# Patient Record
Sex: Male | Born: 1940 | Race: Black or African American | Hispanic: No | Marital: Single | Smoking: Current every day smoker
Health system: Southern US, Community
[De-identification: ages and names within clinical notes are randomized; demographics above are authoritative.]

## PROBLEM LIST (undated history)

## (undated) DIAGNOSIS — F329 Major depressive disorder, single episode, unspecified: Secondary | ICD-10-CM

## (undated) DIAGNOSIS — N400 Enlarged prostate without lower urinary tract symptoms: Secondary | ICD-10-CM

## (undated) DIAGNOSIS — E109 Type 1 diabetes mellitus without complications: Secondary | ICD-10-CM

## (undated) DIAGNOSIS — I10 Essential (primary) hypertension: Secondary | ICD-10-CM

## (undated) DIAGNOSIS — E785 Hyperlipidemia, unspecified: Secondary | ICD-10-CM

## (undated) DIAGNOSIS — F419 Anxiety disorder, unspecified: Secondary | ICD-10-CM

## (undated) DIAGNOSIS — E559 Vitamin D deficiency, unspecified: Secondary | ICD-10-CM

## (undated) DIAGNOSIS — F32A Depression, unspecified: Secondary | ICD-10-CM

## (undated) DIAGNOSIS — F0390 Unspecified dementia without behavioral disturbance: Secondary | ICD-10-CM

## (undated) HISTORY — DX: Type 1 diabetes mellitus without complications: E10.9

## (undated) HISTORY — PX: CATARACT EXTRACTION W/ INTRAOCULAR LENS IMPLANT: SHX1309

## (undated) HISTORY — DX: Depression, unspecified: F32.A

## (undated) HISTORY — DX: Unspecified dementia, unspecified severity, without behavioral disturbance, psychotic disturbance, mood disturbance, and anxiety: F03.90

## (undated) HISTORY — DX: Major depressive disorder, single episode, unspecified: F32.9

## (undated) HISTORY — DX: Essential (primary) hypertension: I10

---

## 2009-06-19 ENCOUNTER — Emergency Department (HOSPITAL_COMMUNITY): Admission: EM | Admit: 2009-06-19 | Discharge: 2009-06-19 | Payer: Self-pay | Admitting: Emergency Medicine

## 2009-06-19 ENCOUNTER — Ambulatory Visit: Payer: Self-pay | Admitting: Vascular Surgery

## 2009-06-19 ENCOUNTER — Encounter (INDEPENDENT_AMBULATORY_CARE_PROVIDER_SITE_OTHER): Payer: Self-pay | Admitting: Emergency Medicine

## 2009-07-08 ENCOUNTER — Emergency Department (HOSPITAL_BASED_OUTPATIENT_CLINIC_OR_DEPARTMENT_OTHER): Admission: EM | Admit: 2009-07-08 | Discharge: 2009-07-08 | Payer: Self-pay | Admitting: Emergency Medicine

## 2009-07-08 ENCOUNTER — Ambulatory Visit: Payer: Self-pay | Admitting: Diagnostic Radiology

## 2010-06-23 LAB — DIFFERENTIAL
Basophils Absolute: 0 10*3/uL (ref 0.0–0.1)
Lymphs Abs: 1.7 10*3/uL (ref 0.7–4.0)
Monocytes Absolute: 0.5 10*3/uL (ref 0.1–1.0)
Monocytes Relative: 6 % (ref 3–12)
Neutro Abs: 6 10*3/uL (ref 1.7–7.7)

## 2010-06-23 LAB — CBC
MCHC: 33.7 g/dL (ref 30.0–36.0)
RBC: 4.54 MIL/uL (ref 4.22–5.81)
RDW: 14.4 % (ref 11.5–15.5)
WBC: 8.3 10*3/uL (ref 4.0–10.5)

## 2010-06-23 LAB — BASIC METABOLIC PANEL
CO2: 28 mEq/L (ref 19–32)
Calcium: 9.5 mg/dL (ref 8.4–10.5)
Creatinine, Ser: 0.9 mg/dL (ref 0.4–1.5)
GFR calc non Af Amer: >60 mL/min (ref >60)

## 2010-06-23 LAB — POCT TOXICOLOGY PANEL

## 2010-06-23 LAB — URINALYSIS, ROUTINE W REFLEX MICROSCOPIC
Hgb urine dipstick: NEGATIVE
Urobilinogen, UA: 0.2 mg/dL (ref 0.0–1.0)

## 2010-06-23 LAB — HEPATIC FUNCTION PANEL
Albumin: 4 g/dL (ref 3.5–5.2)
Bilirubin, Direct: 0 mg/dL (ref 0.0–0.3)
Total Protein: 7.9 g/dL (ref 6.0–8.3)

## 2010-06-23 LAB — ETHANOL: Alcohol, Ethyl (B): <5 mg/dL (ref 0–10)

## 2010-06-28 LAB — CBC
Hemoglobin: 13.2 g/dL (ref 13.0–17.0)
MCV: 92.8 fL (ref 78.0–100.0)
RBC: 4.21 MIL/uL — ABNORMAL LOW (ref 4.22–5.81)
WBC: 8.3 10*3/uL (ref 4.0–10.5)

## 2010-06-28 LAB — DIFFERENTIAL
Basophils Absolute: 0 10*3/uL (ref 0.0–0.1)
Eosinophils Relative: 5 % (ref 0–5)
Monocytes Absolute: 0.6 10*3/uL (ref 0.1–1.0)
Neutrophils Relative %: 59 % (ref 43–77)

## 2010-06-28 LAB — GLUCOSE, CAPILLARY: Glucose-Capillary: 117 mg/dL — ABNORMAL HIGH (ref 70–99)

## 2010-06-28 LAB — BASIC METABOLIC PANEL
BUN: 8 mg/dL (ref 6–23)
Glucose, Bld: 99 mg/dL (ref 70–99)
Potassium: 4.8 mEq/L (ref 3.5–5.1)

## 2011-04-12 ENCOUNTER — Ambulatory Visit (HOSPITAL_COMMUNITY): Payer: Self-pay | Admitting: Psychiatry

## 2011-04-22 ENCOUNTER — Ambulatory Visit (HOSPITAL_COMMUNITY): Payer: Self-pay | Admitting: Psychiatry

## 2011-04-29 ENCOUNTER — Ambulatory Visit (HOSPITAL_COMMUNITY): Payer: Self-pay | Admitting: Psychiatry

## 2011-05-04 ENCOUNTER — Encounter (HOSPITAL_COMMUNITY): Payer: Self-pay | Admitting: Psychiatry

## 2011-05-04 ENCOUNTER — Ambulatory Visit (INDEPENDENT_AMBULATORY_CARE_PROVIDER_SITE_OTHER): Payer: Medicare Other | Admitting: Psychiatry

## 2011-05-04 DIAGNOSIS — F1011 Alcohol abuse, in remission: Secondary | ICD-10-CM

## 2011-05-04 DIAGNOSIS — F329 Major depressive disorder, single episode, unspecified: Secondary | ICD-10-CM

## 2011-05-04 DIAGNOSIS — I10 Essential (primary) hypertension: Secondary | ICD-10-CM

## 2011-05-04 DIAGNOSIS — Z716 Tobacco abuse counseling: Secondary | ICD-10-CM

## 2011-05-04 DIAGNOSIS — F32A Depression, unspecified: Secondary | ICD-10-CM

## 2011-05-04 DIAGNOSIS — F0391 Unspecified dementia with behavioral disturbance: Secondary | ICD-10-CM | POA: Insufficient documentation

## 2011-05-04 DIAGNOSIS — E119 Type 2 diabetes mellitus without complications: Secondary | ICD-10-CM

## 2011-05-04 DIAGNOSIS — Z794 Long term (current) use of insulin: Secondary | ICD-10-CM

## 2011-05-04 DIAGNOSIS — Z7189 Other specified counseling: Secondary | ICD-10-CM

## 2011-05-04 DIAGNOSIS — F0390 Unspecified dementia without behavioral disturbance: Secondary | ICD-10-CM

## 2011-05-04 DIAGNOSIS — F03918 Unspecified dementia, unspecified severity, with other behavioral disturbance: Secondary | ICD-10-CM | POA: Insufficient documentation

## 2011-05-04 DIAGNOSIS — G4700 Insomnia, unspecified: Secondary | ICD-10-CM

## 2011-05-04 MED ORDER — RISPERIDONE 2 MG PO TABS: ORAL_TABLET | ORAL | Status: DC

## 2011-05-04 MED ORDER — BUPROPION HCL ER (XL) 150 MG PO TB24: 150.0000 mg | ORAL_TABLET | Freq: Every day | ORAL | Status: DC

## 2011-05-04 MED ORDER — TRAZODONE HCL 50 MG PO TABS: 50.0000 mg | ORAL_TABLET | Freq: Every day | ORAL | Status: AC

## 2011-05-04 MED ORDER — MEMANTINE HCL 5 MG PO TABS: 5.0000 mg | ORAL_TABLET | Freq: Every day | ORAL | Status: DC

## 2011-05-04 MED ORDER — LORAZEPAM 1 MG PO TABS: ORAL_TABLET | ORAL | Status: DC

## 2011-05-04 NOTE — Patient Instructions (Addendum)
Your  history of being in the [employer] at [facility] has been reviewed as well as the medications you have been taking. You at have reported that you were sleeping through most of the day and at night you can be restless and tosses and turn. Medications have been modified for it the following reasons. It is very possible that the Risperdal is causing sedation as well as treating any history of irritability. The medication Risperdal has been reduced to 2 mg to be given at night because it can induce sleep. The Ativan has been adjusted to the 1 mg at morning and one half tab after lunch. The evening Ativan needs to be discontinued. For sleep trazodone has been ordered at bedtime. It should be given approximately 30 minutes before sleep. The Wellbutrin has been ordered as is 150 mg Wellbutrin XL in the morning after breakfast. The Namenda 5 mg has been ordered to slow to the loss of memory.  Please call the office [phone] if there is any observation that Mr. Henricks is having an adverse reaction to any of these medications. If and also call if you have any questions about the medication changes. Remember if there is any extreme distress, depression, suicidal thinking, disorganized thoughts, please call 911 or called in nearest emergency department. Medications have been ordered with refills for 2 times if Mr. Gobert needs to return sooner than that please call the office for an appointment.  Telephone cal [phone] to Mrs. Brown:  She is asked to return call and leave the name and telephone no. Of his assisted living.  A call needs to be made to them in order to send Rx for Lamirctal [inadvertently overlooked].

## 2011-05-04 NOTE — Progress Notes (Signed)
Psychiatric Assessment Adult  Patient Identification:  Keyan Folson Date of Evaluation:  05/04/2011 Chief Complaint: 0 History of Chief Complaint:   Chief Complaint  Patient presents with  . Depression  . Medication Refill  . Dementia    HPI Review of Systems Physical Exam  Depressive Symptoms: depressed mood, anhedonia, hypersomnia, psychomotor retardation, difficulty concentrating, anxiety, disturbed sleep,  (Hypo) Manic Symptoms:   Elevated Mood:  No Irritable Mood:  No Grandiosity:  No Distractibility:  No Labiality of Mood:  No Delusions:  No Hallucinations:  No Impulsivity:  No Sexually Inappropriate Behavior:  No Financial Extravagance:  No Flight of Ideas:  No  Anxiety Symptoms: Excessive Worry:  No Panic Symptoms:  No Agoraphobia:  No Obsessive Compulsive: No  Symptoms: None, Specific Phobias:  No Social Anxiety:  Yes  Psychotic Symptoms:  Hallucinations: No None Delusions:  No Paranoia:  No   Ideas of Reference:  No  PTSD Symptoms: Ever had a traumatic exposure:  No Had a traumatic exposure in the last month:  No Re-experiencing: No None Hypervigilance:  No Hyperarousal: No None Avoidance: No None  Traumatic Brain Injury: No  Past Psychiatric History: Diagnosis: Depression, Dementia  Hospitalizations: 1  Outpatient Care: assisted living  Substance Abuse Care: no  Self-Mutilation: no  Suicidal Attempts: none  Violent Behaviors: only "irritable"   Past Medical History:   Past Medical History  Diagnosis Date  . Depression   . Dementia     may have had a stroke   History of Loss of Consciousness:  Yes Seizure History:  No Cardiac History:  Yes Allergies:   Allergies  Allergen Reactions  . Penicillins    Current Medications:  Current Outpatient Prescriptions  Medication Sig Dispense Refill  . buPROPion (WELLBUTRIN XL) 150 MG 24 hr tablet Take 1 tablet (150 mg total) by mouth daily after breakfast.  30 tablet  2  .  LORazepam (ATIVAN) 1 MG tablet Take 1 tab orally in the morning and one half tab in the afternoon after lunch.  45 tablet  2  . memantine (NAMENDA) 5 MG tablet Take 1 tablet (5 mg total) by mouth daily.  30 tablet  2  . risperiDONE (RISPERDAL) 2 MG tablet Take one tablet By mouth  before bedtime  30 tablet  2  . traZODone (DESYREL) 50 MG tablet Take 1 tablet (50 mg total) by mouth at bedtime.  30 tablet  2    Previous Psychotropic Medications:  Medication Dose   Risperdal 3mg   Ativan  2 12 mg  Wellbutrin XL  150 mg   Lamictal  100 mg  ambien 10 mg         Substance Abuse History in the last 12 months: Substance Age of 1st Use Last Use Amount Specific Type  Nicotine 15 presently  1/2 ppd cigarettes  Alcohol  15  2008  6 pk/d beer  Cannabis      Opiates      Cocaine      Methamphetamines      LSD      Ecstasy      Benzodiazepines      Caffeine      Inhalants      Others:                          Medical Consequences of Substance Abuse: 0  Legal Consequences of Substance Abuse: 0  Family Consequences of Substance Abuse: 0  Blackouts:  No DT's:  No Withdrawal Symptoms:  No None  Social History: Current Place of Residence: [place] Place of Birth: [place] Family Members: 8 Marital Status:  Divorced Children: 3  Sons:   Daughters:  Relationships: siter and brother i Social worker Education:  Brewing technologist: never worked as Public affairs consultant Religious Beliefs/Practices: 0 History of Abuse: none Armed forces technical officer; Hotel manager History:  Electronics engineer History: 0 Hobbies/Interests: 0  Family History:  History reviewed. No pertinent family history.  Mental Status Examination/Evaluation: Objective:  Appearance: Malodorous  Eye Contact::  Fair  Speech:  Slow  Volume:  Decreased  Mood:  fair  Affect:  Blunt  Thought Process:  Logical  Orientation:  Full  Thought Content:  0  Suicidal Thoughts:  No  Homicidal Thoughts:  No    Judgement:  Impaired  Insight:  Fair  Psychomotor Activity:  Decreased  Akathisia:  No  Handed:  Right  AIMS (if indicated):  0  Assets:  Financial Resources/Insurance Housing Social Support    Laboratory/X-Ray Psychological Evaluation(s)   0  0   Assessment:  This is a man who was found unconscious of unknown hours/days by mailman.  He was sent to hospital for diabetes type II now taking insulin  He was sent to inpatient psychiatric unit in [place].  Today he is malodorous, wearing casual clothes.  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  H\is responses are slow but logical, speech is slightly slurred.  He is sleeping most of the day at assisted living.  He was placed in this facility when his sister Mrs. Denton Lank [retired professor of Education] was called to get him in [place] and took him to the assisted living facility.  She knows very little about his independent life after he retired [20 yrs] from [employer].  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    AXIS I Mood Disorder NOS, Social Anxiety and Dementia, reported by MD; past hx of found unconsious due to hyperglycemia  AXIS II Deferred  AXIS III Past Medical History  Diagnosis Date  . Depression   . Dementia     may have had a stroke     AXIS IV problems related to social environment  AXIS V 51-60 moderate symptoms   Treatment Plan/Recommendations:  Plan of Care: Pt escorted by elderly sister; requests appts. Every 6 mos.   Laboratory:  Later visit; suggest routine check  Psychotherapy: 0  Medications: see chart  Routine PRN Medications:  No  Consultations: 0  Safety Concerns:  Is in supervised assisted living   Other:  The sister is his payee for [place] benefits.  It is recommended to her to discuss and plan for 1- power of attorney and 2- help him decide on living will/advanced directive    Mickeal Skinner, MD 1/30/201311:23 AM

## 2011-05-11 ENCOUNTER — Ambulatory Visit (HOSPITAL_COMMUNITY): Payer: Self-pay | Admitting: Psychiatry

## 2011-08-02 ENCOUNTER — Ambulatory Visit (HOSPITAL_COMMUNITY): Payer: Self-pay | Admitting: Psychiatry

## 2011-08-03 ENCOUNTER — Encounter (HOSPITAL_COMMUNITY): Payer: Self-pay | Admitting: Psychiatry

## 2011-08-03 ENCOUNTER — Ambulatory Visit (INDEPENDENT_AMBULATORY_CARE_PROVIDER_SITE_OTHER): Payer: Medicare Other | Admitting: Psychiatry

## 2011-08-03 VITALS — BP 152/91 | HR 92 | Ht 67.0 in | Wt 172.0 lb

## 2011-08-03 DIAGNOSIS — F329 Major depressive disorder, single episode, unspecified: Secondary | ICD-10-CM

## 2011-08-03 DIAGNOSIS — F0390 Unspecified dementia without behavioral disturbance: Secondary | ICD-10-CM

## 2011-08-03 MED ORDER — BUPROPION HCL ER (XL) 150 MG PO TB24: 150.0000 mg | ORAL_TABLET | Freq: Every day | ORAL | Status: DC

## 2011-08-03 MED ORDER — RISPERIDONE 2 MG PO TABS: ORAL_TABLET | ORAL | Status: DC

## 2011-08-03 NOTE — Progress Notes (Addendum)
[facility] Follow-up Outpatient Visit  Majour Frei [birth year]  Date:    Mr. Meddaugh is a 71 Y/o male with a past psychiatric history significant for Dementia, not otherwise specified. The patient is referred for psychiatric services for medication management.   The patient denies any current stressors.  In the area of affective symptoms, patient appears euthymic. Patient denies current suicidal ideation, intent, or plan. Patient denies current homicidal ideation, intent, or plan. Patient denies auditory hallucinations. Patient denies visual hallucinations. Patient denies symptoms of paranoia. Patient states sleep is good. Appetite is good. Energy level is low. Patient denies symptoms of anhedonia. Patient denies hopelessness, helplessness, or guilt.   Denies any recent episodes consistent with mania, particularly decreased need for sleep with increased energy, grandiosity, impulsivity, hyperverbal and pressured speech, or increased productivity. Denies any recent symptoms consistent with psychosis, particularly auditory or visual hallucinations, thought broadcasting/insertion/withdrawal, or ideas of reference. Also denies excessive worry to the point of physical symptoms as well as any panic attacks. Denies any history of trauma or symptoms consistent with PTSD such as flashbacks, nightmares, hypervigilance, feelings of numbness or inability to connect with others.   Called patient's assisted living facility. Reported elevated blood pressures. Unable to ascertain indication for lamotrignine. Patient extender provider recently increased lorazepam.  Past Psychiatric History: Reviewed Diagnosis: Depression, Dementia   Hospitalizations: One previous psychiatric hospitalization  Outpatient Care: assisted living   Substance Abuse Care: no   Self-Mutilation: no   Suicidal Attempts: none   Violent Behaviors: only "irritable" -verbal aggressiveness   Past Medical History:   Reviewed Past Medical History   Diagnosis  Date   .  Depression    .  Dementia      may have had a stroke    History of Loss of Consciousness: Yes  Seizure History: No  Cardiac History: Yes  Allergies:  Reviewed Allergies   Allergen  Reactions   .  Penicillins     Current Medications:  Reviewed Current Outpatient Prescriptions on File Prior to Visit  Medication Sig Dispense Refill  . albuterol (PROVENTIL HFA;VENTOLIN HFA) 108 (90 BASE) MCG/ACT inhaler Inhale 2 puffs into the lungs every 6 (six) hours as needed.      Marland Kitchen buPROPion (WELLBUTRIN XL) 150 MG 24 hr tablet Take 1 tablet (150 mg total) by mouth daily after breakfast.  30 tablet  2  . cetirizine (ZYRTEC) 10 MG tablet Take 10 mg by mouth daily.      . hydrochlorothiazide (HYDRODIURIL) 25 MG tablet Take 25 mg by mouth daily.      . insulin aspart (NOVOLOG) 100 UNIT/ML injection Inject 2 Units into the skin 3 (three) times daily before meals.      . insulin detemir (LEVEMIR) 100 UNIT/ML injection Inject 10 Units into the skin at bedtime.      . lamoTRIgine (LAMICTAL) 100 MG tablet Take 50 mg by mouth 2 (two) times daily.      Marland Kitchen LORazepam (ATIVAN) 1 MG tablet Take 1 tab orally in the morning and one half tab in the afternoon after lunch.  45 tablet  2  . losartan (COZAAR) 100 MG tablet Take 100 mg by mouth daily.      . memantine (NAMENDA) 5 MG tablet Take 1 tablet (5 mg total) by mouth daily.  30 tablet  2  . metoprolol succinate (TOPROL-XL) 100 MG 24 hr tablet Take 100 mg by mouth daily. Take with or immediately following a meal.      .  omeprazole (PRILOSEC) 20 MG capsule Take 20 mg by mouth daily.      . potassium chloride SA (K-DUR,KLOR-CON) 20 MEQ tablet Take 20 mEq by mouth every morning.      . risperiDONE (RISPERDAL) 2 MG tablet Take one tablet By mouth  before bedtime  30 tablet  2     Previous Psychotropic Medications:  Reviewed Medication  Dose   Risperdal  3 mg   Ativan  2 12 mg   Wellbutrin XL  150 mg   Lamictal   100 mg   Ambien  10 mg    Substance Abuse History in the last 12 months:  Reviewed Caffeine: Patient reports rare use Nicotine: Cigarettes-1/2 PPD Alcohol: Patient denies Illicit Substances: Patient denies.  Medical Consequences of Substance Abuse: 0  Legal Consequences of Substance Abuse: 0  Family Consequences of Substance Abuse: 0  Blackouts: No  DT's: No  Withdrawal Symptoms: No None    Social History: Reviewed Current Place of Residence: [place]  Place of Birth: [place]  Family Members: 8  Marital Status: Divorced  Children: 3  Sons:  Daughters:  Relationships: sister and brother in Social worker  Education: Acupuncturist Problems/Performance: never worked as Public affairs consultant  Religious Beliefs/Practices: 0  History of Abuse: none  Armed forces technical officer;  Hotel manager History: Banker History: 0  Hobbies/Interests: 0   Family History: History reviewed. No pertinent family history.    Mental Status Examination/Evaluation: Reviewed Objective: Appearance: Malodorous   Eye Contact:: Fair   Speech: Slow   Volume: Decreased   Mood: "fine"  Affect: Blunt   Thought Process: Logical   Orientation: Full   Thought Content: 0   Suicidal Thoughts: No   Homicidal Thoughts: No   Judgement: Impaired   Insight: Fair   Psychomotor Activity: Decreased   Akathisia: No   Handed: Right   AIMS (if indicated): 0   Assets: Financial Resources/Insurance  Housing  Social Support   Memory: Immediate 3/3 , recent- 2/3  Laboratory/X-Ray   No recent labs   Assessment:    AXIS I  Dementia, NOS  AXIS II  No Diagnosis.  AXIS III  Diabetes Mellitus, Type II; Hypertension                        AXIS IV  problems related to social environment   AXIS V  51-60 moderate symptoms    Treatment Plan/Recommendations:   PLAN:  1. Affirm with the patient that the medications are taken as ordered. Patient  expressed understanding of how their medications were to  be used.  2. Continue the following psychiatric medications as written prior to this appointment/ with the following changes:  a) Continue bupropion XL 150 mg daily. b) Continue Risperidone 2 mg QHS. c) Will speak to PCP regarding adjusting the patient's medications. d) As noted by previous provider: The sister is his payee for [place] benefits. 3. Therapy: brief supportive therapy provided. Continue current services.  4. Risks and benefits, side effects and alternatives discussed with patient, he and his sister was given an opportunity to ask questions about his medication, illness, and treatment. All current psychiatric  medications have been reviewed and discussed with the patient and adjusted as clinically appropriate. The patient has been provided an accurate and updated list of the medications being now prescribed.  5. Patient told to call clinic if any problems occur. Patient advised to go to ER  if she should develop SI/HI, side  effects, or if symptoms worsen.  6. No labs warranted at this time.  7. The patient was encouraged to keep all PCP and specialty clinic appointments.  8. Patient was instructed to return to clinic in 1 month.  The patient reported to her previous provider she would like q 6months. 9.  The patient's sister expressed understanding of the plan and agrees with the above.  Jacqulyn Cane, MD

## 2011-08-05 ENCOUNTER — Encounter (HOSPITAL_COMMUNITY): Payer: Self-pay | Admitting: Psychiatry

## 2011-08-08 ENCOUNTER — Telehealth (HOSPITAL_COMMUNITY): Payer: Self-pay | Admitting: Psychiatry

## 2011-08-10 NOTE — Telephone Encounter (Signed)
Attempted to call patient's PCP regarding medications.

## 2011-08-23 ENCOUNTER — Telehealth (HOSPITAL_COMMUNITY): Payer: Self-pay | Admitting: Psychiatry

## 2011-08-23 NOTE — Telephone Encounter (Signed)
Will review patient's medications at his next encounter.

## 2011-08-31 ENCOUNTER — Encounter (HOSPITAL_COMMUNITY): Payer: Self-pay | Admitting: Psychiatry

## 2011-08-31 ENCOUNTER — Ambulatory Visit (INDEPENDENT_AMBULATORY_CARE_PROVIDER_SITE_OTHER): Payer: Medicare Other | Admitting: Psychiatry

## 2011-08-31 VITALS — BP 147/95 | HR 98 | Ht 67.0 in | Wt 172.0 lb

## 2011-08-31 DIAGNOSIS — F0390 Unspecified dementia without behavioral disturbance: Secondary | ICD-10-CM

## 2011-08-31 DIAGNOSIS — F329 Major depressive disorder, single episode, unspecified: Secondary | ICD-10-CM

## 2011-08-31 MED ORDER — MEMANTINE HCL 5 MG PO TABS: 5.0000 mg | ORAL_TABLET | Freq: Every day | ORAL | Status: DC

## 2011-08-31 MED ORDER — BUPROPION HCL ER (XL) 150 MG PO TB24: 150.0000 mg | ORAL_TABLET | Freq: Every day | ORAL | Status: DC

## 2011-08-31 MED ORDER — RISPERIDONE 2 MG PO TABS: ORAL_TABLET | ORAL | Status: DC

## 2011-08-31 NOTE — Progress Notes (Signed)
[facility] Follow-up Outpatient Visit  Antonio Flynn 12/27/1940  Date: 08/31/11  HPI:  Antonio Flynn is a 71 Y/o male with a past psychiatric history significant for Dementia, NOS. The patient is referred for psychiatric services for medication management.  The patient had previous episodes of "verbal agressiveness" which led to the initiation of Wellbutrin and Risperdal.   The patient denies any current stressors.  He is here with his oldest sister who provides collateral information.  His sister reports that the patient has been doing well on his current medications. She states that his elevated blood pressures at his last visit  were addressed with the patient's PCP and his blood pressure medications were adjusted.   In the area of affective symptoms, patient appears euthymic. Patient denies current suicidal ideation, intent, or plan. Patient denies current homicidal ideation, intent, or plan. Patient denies auditory hallucinations. Patient denies visual hallucinations. Patient denies symptoms of paranoia. Patient states sleep is good. Appetite is good. Energy level is fair to low. Patient denies symptoms of anhedonia. Patient denies hopelessness, helplessness, or guilt.   Denies any recent episodes consistent with mania, particularly decreased need for sleep with increased energy, grandiosity, impulsivity, hyperverbal and pressured speech, or increased productivity. Denies any recent symptoms consistent with psychosis, particularly auditory or visual hallucinations, thought broadcasting/insertion/withdrawal, or ideas of reference. Also denies excessive worry to the point of physical symptoms as well as any panic attacks. Denies any history of trauma or symptoms consistent with PTSD such as flashbacks, nightmares, hypervigilance, feelings of numbness or inability to connect with others.    Past Psychiatric History: Reviewed  Diagnosis: Depression, Dementia   Hospitalizations: One  previous psychiatric hospitalization   Outpatient Care: assisted living   Substance Abuse Care: no   Self-Mutilation: no   Suicidal Attempts: none   Violent Behaviors: only "irritable" -verbal aggressiveness in the past   Past Medical History: Reviewed  Past Medical History   Diagnosis  Date   .  Depression    .  Dementia      may have had a stroke    History of Loss of Consciousness: Yes  Seizure History: No  Cardiac History: Yes   Allergies: Reviewed  Allergies   Allergen  Reactions   .  Penicillins     Current Medications: Reviewed  Current Outpatient Prescriptions on File Prior to Visit  Medication Sig Dispense Refill  . albuterol (PROVENTIL HFA;VENTOLIN HFA) 108 (90 BASE) MCG/ACT inhaler Inhale 2 puffs into the lungs every 6 (six) hours as needed.      Marland Kitchen aspirin 325 MG tablet Take 325 mg by mouth daily.      . cetirizine (ZYRTEC) 10 MG tablet Take 10 mg by mouth daily.      Marland Kitchen guaiFENesin (MUCINEX) 600 MG 12 hr tablet Take 600 mg by mouth 2 (two) times daily.      . hydrochlorothiazide (HYDRODIURIL) 25 MG tablet Take 25 mg by mouth daily.      . hydrOXYzine (ATARAX/VISTARIL) 50 MG tablet Take 50 mg by mouth 3 (three) times daily as needed.      . insulin aspart (NOVOLOG) 100 UNIT/ML injection Inject 2 Units into the skin 3 (three) times daily before meals.      . insulin detemir (LEVEMIR) 100 UNIT/ML injection Inject 10 Units into the skin at bedtime.      . lamoTRIgine (LAMICTAL) 100 MG tablet Take 50 mg by mouth 2 (two) times daily.      . Linaclotide 145  MCG CAPS Take 145 mg by mouth every morning.      Marland Kitchen LORazepam (ATIVAN) 1 MG tablet Take 1 tab orally in the morning and one half tab in the afternoon after lunch.  45 tablet  2  . losartan (COZAAR) 100 MG tablet Take 100 mg by mouth daily.      . metoprolol succinate (TOPROL-XL) 100 MG 24 hr tablet Take 100 mg by mouth daily. Take with or immediately following a meal.      . omeprazole (PRILOSEC) 20 MG capsule Take 20 mg  by mouth daily.      . potassium chloride SA (K-DUR,KLOR-CON) 20 MEQ tablet Take 20 mEq by mouth every morning.      . Tamsulosin HCl (FLOMAX) 0.4 MG CAPS Take 0.4 mg by mouth 2 (two) times daily.      Marland Kitchen buPROPion (WELLBUTRIN XL) 150 MG 24 hr tablet Take 1 tablet (150 mg total) by mouth daily after breakfast.  30 tablet  2  .  memantine (NAMENDA) 5 MG tablet Take 1 tablet (5 mg total) by mouth daily.  30 tablet  2  . risperiDONE (RISPERDAL) 2 MG tablet Take one tablet By mouth  before bedtime  30 tablet  2   Previous Psychotropic Medications: Reviewed  Medication  Dose   Risperdal  3 mg   Ativan  2 12 mg   Wellbutrin XL  150 mg   Lamictal  100 mg   Ambien  10 mg    Substance Abuse History in the last 12 months: Reviewed  Caffeine: Patient reports rare use  Nicotine: Cigarettes-1/2 PPD  Alcohol: Patient denies  Illicit Substances: Patient denies.   Medical Consequences of Substance Abuse: 0  Legal Consequences of Substance Abuse: 0  Family Consequences of Substance Abuse: 0  Blackouts: No  DT's: No  Withdrawal Symptoms: No None   Social History: Reviewed  Current Place of Residence: [place]  Place of Birth: [place]  Family Members: 8  Marital Status: Divorced  Children: 3  Antonio Flynn:  Antonio Flynn:  Relationships: sister and brother in Social worker  Education: Acupuncturist Problems/Performance: never worked as Public affairs consultant  Religious Beliefs/Practices: 0  History of Abuse: none  Armed forces technical officer;  Hotel manager History: Banker History: 0  Hobbies/Interests: 0   Family History: History reviewed. No pertinent family history.   Mental Status Examination/Evaluation: Reviewed  Objective: Appearance: Malodorous   Eye Contact:: Fair   Speech: Slow   Volume: Decreased   Mood: "fine"   Affect: Blunt   Thought Process: Logical   Orientation: Full   Thought Content: 0   Suicidal Thoughts: No   Homicidal Thoughts: No   Judgement: Impaired   Insight:  Fair   Psychomotor Activity: Decreased   Akathisia: No   Handed: Right   Memory: Immediate 3/3 , recent- 2/3  AIMS (if indicated): See Chart  Assets: Financial Resources/Insurance  Housing  Social Support      Laboratory/X-Ray   No recent labs    Assessment:  AXIS I  Dementia, NOS   AXIS II  No Diagnosis.   AXIS III  Diabetes Mellitus, Type II; Hypertension   AXIS IV  problems related to social environment   AXIS V  51-60 moderate symptoms    Treatment Plan/Recommendations:  PLAN:  1. Affirm with the patient that the medications are taken as ordered. Patient  expressed understanding of how their medications were to be used.  2. Continue the following psychiatric  medications as written  prior to this appointment/ with the following changes:  a) Continue bupropion XL 150 mg daily.  b) Continue Risperidone 2 mg QHS.  c) continue memantine 5 mg daily 3. Therapy: brief supportive therapy provided. Continue current services.  4. Risks and benefits, side effects and alternatives discussed with patient, he and his sister was given an opportunity to ask questions about his medication, illness, and treatment. All current psychiatric  medications have been reviewed and discussed with the patient and adjusted as clinically appropriate. The patient has been provided an accurate and updated list of the medications being now prescribed.  5. Patient told to call clinic if any problems occur. Patient advised to go to ER if she should develop SI/HI, side effects, or if symptoms worsen.  6. No labs warranted at this time.  7. The patient was encouraged to keep all PCP and specialty clinic appointments.  8. Patient was instructed to return to clinic in 2 months. Will move patient to Q 68month visits if he continues to be stable on his medications. 9. The patient's sister expressed understanding of the plan and agrees with the above.      Jacqulyn Cane, MD

## 2011-10-31 ENCOUNTER — Ambulatory Visit (INDEPENDENT_AMBULATORY_CARE_PROVIDER_SITE_OTHER): Payer: Medicare Other | Admitting: Psychiatry

## 2011-10-31 ENCOUNTER — Encounter (HOSPITAL_COMMUNITY): Payer: Self-pay | Admitting: Psychiatry

## 2011-10-31 VITALS — BP 154/102 | HR 66 | Ht 67.0 in | Wt 174.0 lb

## 2011-10-31 DIAGNOSIS — F329 Major depressive disorder, single episode, unspecified: Secondary | ICD-10-CM

## 2011-10-31 DIAGNOSIS — F0390 Unspecified dementia without behavioral disturbance: Secondary | ICD-10-CM

## 2011-10-31 MED ORDER — MEMANTINE HCL 5 MG PO TABS: 5.0000 mg | ORAL_TABLET | Freq: Every day | ORAL | Status: DC

## 2011-10-31 MED ORDER — RISPERIDONE 2 MG PO TABS: ORAL_TABLET | ORAL | Status: DC

## 2011-10-31 MED ORDER — BUPROPION HCL ER (XL) 150 MG PO TB24: 150.0000 mg | ORAL_TABLET | Freq: Every day | ORAL | Status: DC

## 2011-10-31 NOTE — Progress Notes (Signed)
[facility] Follow-up Outpatient Visit  Antonio Flynn [birth year]  Date: 10/31/2011  HPI:  [name] is a 71 Y/o male with a past psychiatric history significant for Dementia, NOS. The patient is referred for psychiatric services for medication management.   The patient denies any current stressors. He is here with his oldest sister who provides collateral information. His sister reports that the patient has been doing well on his current medications. She states that his elevated blood pressures at his last visit were addressed with the patient's PCP and his blood pressure medications were adjusted.   In the area of affective symptoms, patient appears euthymic. Patient denies current suicidal ideation, intent, or plan. Patient denies current homicidal ideation, intent, or plan. Patient denies auditory hallucinations. Patient denies visual hallucinations. Patient denies symptoms of paranoia. Patient states sleep is good, with approximately 9 hours daily. Appetite is good. Energy level is fair to low. Patient denies symptoms of anhedonia. Patient denies hopelessness, helplessness, or guilt.   Denies any recent episodes consistent with mania, particularly decreased need for sleep with increased energy, grandiosity, impulsivity, hyperverbal and pressured speech, or increased productivity. Denies any recent symptoms consistent with psychosis, particularly auditory or visual hallucinations, thought broadcasting/insertion/withdrawal, or ideas of reference. Also denies excessive worry to the point of physical symptoms as well as any panic attacks. Denies any history of trauma or symptoms consistent with PTSD such as flashbacks, nightmares, hypervigilance, feelings of numbness or inability to connect with others.   Review of Systems  Constitutional: Positive for malaise/fatigue. Negative for fever, chills, weight loss and diaphoresis.  HENT: Positive for ear discharge.   Respiratory:  Negative.   Cardiovascular: Negative.   Gastrointestinal: Negative.   Neurological: Negative for weakness.   Filed Vitals:   10/31/11 1341  BP: 154/102  Pulse: 66  Height: 5\' 7"  (1.702 m)  Weight: 174 lb (78.926 kg)   Physical Exam  Vitals reviewed. Constitutional: He appears well-developed and well-nourished. No distress.  Skin: He is not diaphoretic.   Past Psychiatric History: Reviewed  Diagnosis: Depression, Dementia   Hospitalizations: One previous psychiatric hospitalization   Outpatient Care: assisted living   Substance Abuse Care: no   Self-Mutilation: no   Suicidal Attempts: none   Violent Behaviors: only "irritable" -verbal aggressiveness in the past    Past Medical History: Reviewed  Past Medical History   Diagnosis  Date   .  Depression    .  Dementia      may have had a stroke    History of Loss of Consciousness: Yes  Seizure History: No  Cardiac History: Yes   Allergies: Reviewed  Allergies   Allergen  Reactions   .  Penicillins     Current Medications: Reviewed Current Outpatient Prescriptions on File Prior to Visit  Medication Sig Dispense Refill  . albuterol (PROVENTIL HFA;VENTOLIN HFA) 108 (90 BASE) MCG/ACT inhaler Inhale 2 puffs into the lungs every 6 (six) hours as needed.      Marland Kitchen aspirin 325 MG tablet Take 325 mg by mouth daily.      Marland Kitchen buPROPion (WELLBUTRIN XL) 150 MG 24 hr tablet Take 1 tablet (150 mg total) by mouth daily after breakfast.  30 tablet  1  . cetirizine (ZYRTEC) 10 MG tablet Take 10 mg by mouth daily.      . cholecalciferol (VITAMIN D) 1000 UNITS tablet Take 1,000 Units by mouth daily.      Marland Kitchen docusate sodium (COLACE) 100 MG capsule Take 100 mg by mouth  every morning.      Marland Kitchen guaiFENesin (MUCINEX) 600 MG 12 hr tablet Take 600 mg by mouth 2 (two) times daily.      . hydrochlorothiazide (HYDRODIURIL) 25 MG tablet Take 25 mg by mouth daily.      . hydrOXYzine (ATARAX/VISTARIL) 50 MG tablet Take 50 mg by mouth 3 (three) times daily as  needed.      . insulin aspart (NOVOLOG) 100 UNIT/ML injection Inject 2 Units into the skin 3 (three) times daily before meals.      . insulin detemir (LEVEMIR) 100 UNIT/ML injection Inject 10 Units into the skin at bedtime.      . lamoTRIgine (LAMICTAL) 100 MG tablet Take 50 mg by mouth 2 (two) times daily.      . Linaclotide 145 MCG CAPS Take 145 mg by mouth every morning.      Marland Kitchen LORazepam (ATIVAN) 1 MG tablet Take 1 tab orally in the morning and one half tab in the afternoon after lunch.  45 tablet  2  . losartan (COZAAR) 100 MG tablet Take 100 mg by mouth daily.      . memantine (NAMENDA) 5 MG tablet Take 1 tablet (5 mg total) by mouth daily.  30 tablet  1  . metoprolol succinate (TOPROL-XL) 100 MG 24 hr tablet Take 100 mg by mouth daily. Take with or immediately following a meal.      . naproxen (NAPROSYN) 500 MG tablet Take 500 mg by mouth daily as needed.      Marland Kitchen omeprazole (PRILOSEC) 20 MG capsule Take 20 mg by mouth daily.      . potassium chloride SA (K-DUR,KLOR-CON) 20 MEQ tablet Take 20 mEq by mouth every morning.      . risperiDONE (RISPERDAL) 2 MG tablet Take one tablet By mouth  before bedtime  30 tablet  1  . Tamsulosin HCl (FLOMAX) 0.4 MG CAPS Take 0.4 mg by mouth 2 (two) times daily.         Previous Psychotropic Medications: Reviewed  Medication  Dose   Risperdal  3 mg   Ativan  2 12 mg   Wellbutrin XL  150 mg   Lamictal  100 mg   Ambien  10 mg    Substance Abuse History in the last 12 months: Reviewed  Caffeine: Patient reports rare use  Nicotine: Cigarettes-1/2 PPD  Alcohol: Patient denies  Illicit Substances: Patient denies.   Medical Consequences of Substance Abuse: 0   Legal Consequences of Substance Abuse: 0   Family Consequences of Substance Abuse: 0  Blackouts: No  DT's: No  Withdrawal Symptoms: No None   Social History: Reviewed  Current Place of Residence: [place]  Place of Birth: [place]  Family Members: 8  Marital Status: Divorced    Children: 3  Sons:  Daughters:  Relationships: sister and brother in Social worker  Education: Acupuncturist Problems/Performance: never worked as Public affairs consultant  Religious Beliefs/Practices: 0  History of Abuse: none  Armed forces technical officer;  Hotel manager History: Banker History: 0  Hobbies/Interests: 0   Family History: History reviewed. No pertinent family history.   Mental Status Examination/Evaluation: Reviewed  Objective: Appearance: Malodorous   Eye Contact:: Fair   Speech: Slow   Volume: Decreased   Mood: "good"   Affect: Blunt   Thought Process: Logical   Orientation: Full   Thought Content: 0   Suicidal Thoughts: No   Homicidal Thoughts: No   Judgement: Impaired   Insight: Fair  Psychomotor Activity: Decreased   Akathisia: No   Handed: Right   Memory: Immediate 3/3 , recent- 2/3   AIMS (if indicated): See Chart   Assets: Financial Resources/Insurance  Housing  Social Support    Laboratory/X-Ray   No recent labs    Assessment:  AXIS I  Dementia, NOS   AXIS II  No Diagnosis.   AXIS III  Diabetes Mellitus, Type II; Hypertension   AXIS IV  problems related to social environment   AXIS V  51-60 moderate symptoms    Treatment Plan/Recommendations:  PLAN:  1. Affirm with the patient that the medications are taken as ordered. Patient  expressed understanding of how their medications were to be used.  2. Continue the following psychiatric medications as written prior to this appointment/ with the following changes:  a) Continue bupropion XL 150 mg daily.  b) Continue Risperidone 2 mg QHS.  c) continue memantine 5 mg daily  3. Therapy: brief supportive therapy provided. Continue current services.  4. Risks and benefits, side effects and alternatives discussed with patient, he and his sister was given an opportunity to ask questions about his medication, illness, and treatment. All current psychiatric  medications have been reviewed and discussed  with the patient and adjusted as clinically appropriate. The patient has been provided an accurate and updated list of the medications being now prescribed.  5. Patient told to call clinic if any problems occur. Patient advised to go to ER if she should develop SI/HI, side effects, or if symptoms worsen.  6. No labs warranted at this time.  7. The patient was encouraged to keep all PCP and specialty clinic appointments.  8. Patient was instructed to return to clinic in 3 months. 9. The patient's sister expressed understanding of the plan and agrees with the above.   Jacqulyn Cane, MD

## 2012-01-31 ENCOUNTER — Encounter (HOSPITAL_COMMUNITY): Payer: Self-pay | Admitting: Psychiatry

## 2012-01-31 ENCOUNTER — Ambulatory Visit (INDEPENDENT_AMBULATORY_CARE_PROVIDER_SITE_OTHER): Payer: Medicare Other | Admitting: Psychiatry

## 2012-01-31 VITALS — BP 133/87 | HR 68 | Ht 67.0 in | Wt 178.0 lb

## 2012-01-31 DIAGNOSIS — F329 Major depressive disorder, single episode, unspecified: Secondary | ICD-10-CM

## 2012-01-31 DIAGNOSIS — F0390 Unspecified dementia without behavioral disturbance: Secondary | ICD-10-CM

## 2012-01-31 MED ORDER — RISPERIDONE 2 MG PO TABS: ORAL_TABLET | ORAL | Status: DC

## 2012-01-31 MED ORDER — MEMANTINE HCL 5 MG PO TABS: 5.0000 mg | ORAL_TABLET | Freq: Every day | ORAL | Status: DC

## 2012-01-31 MED ORDER — BUPROPION HCL ER (XL) 150 MG PO TB24: 150.0000 mg | ORAL_TABLET | Freq: Every day | ORAL | Status: DC

## 2012-01-31 NOTE — Progress Notes (Signed)
[facility] Follow-up Outpatient Visit  Antonio Flynn 1940/09/08  Date:  01/31/2012  HPI:   Antonio Flynn is a 71 Y/o male with a past psychiatric history significant for Dementia, NOS. The patient is referred for psychiatric services for medication management.   The patient denies any current stressors. He is here today with his brother. His brother sees the patient once a month. The patient's brother wishes that the patient was more social and would come out more often but denies any further concerns.  The patient's living facility was contacted, they deny any concerns for the patient and report that his behavior is good.   In the area of affective symptoms, patient appears euthymic. Patient denies current suicidal ideation, intent, or plan. Patient denies current homicidal ideation, intent, or plan. Patient denies auditory hallucinations. Patient denies visual hallucinations. Patient denies symptoms of paranoia. Patient states sleep is good, with approximately 9 hours daily. Appetite is good. Energy level is fair to low. Patient denies symptoms of anhedonia. Patient denies hopelessness, helplessness, or guilt.   Denies any recent episodes consistent with mania, particularly decreased need for sleep with increased energy, grandiosity, impulsivity, hyperverbal and pressured speech, or increased productivity. Denies any recent symptoms consistent with psychosis, particularly auditory or visual hallucinations, thought broadcasting/insertion/withdrawal, or ideas of reference. Also denies excessive worry to the point of physical symptoms as well as any panic attacks. Denies any history of trauma or symptoms consistent with PTSD such as flashbacks, nightmares, hypervigilance, feelings of numbness or inability to connect with others.   Review of Systems  Constitutional: . Negative for fever, chills, weight loss and diaphoresis.  HENT: Negative. Respiratory: Negative.  Cardiovascular:  Negative.  Gastrointestinal: Negative.  Neurological: Negative for weakness.   Filed Vitals:   01/31/12 1043  BP: 133/87  Pulse: 68  Height: 5\' 7"  (1.702 m)  Weight: 178 lb (80.74 kg)   Physical Exam  Vitals reviewed.  Constitutional: He appears well-developed and well-nourished. No distress.  Skin: He is not diaphoretic.   Past Psychiatric History: Reviewed  Diagnosis: Depression, Dementia   Hospitalizations: One previous psychiatric hospitalization   Outpatient Care: assisted living   Substance Abuse Care: no   Self-Mutilation: no   Suicidal Attempts: none   Violent Behaviors: only "irritable" -verbal aggressiveness in the past prior to risperidone    Past Medical History: Reviewed  Past Medical History   Diagnosis  Date   .  Depression    .  Dementia      may have had a stroke    History of Loss of Consciousness: Yes  Seizure History: No  Cardiac History: Yes   Allergies: Reviewed  Allergies   Allergen  Reactions   .  Penicillins     Current Medications: Reviewed  Current Outpatient Prescriptions on File Prior to Visit  Medication Sig Dispense Refill  . albuterol (PROVENTIL HFA;VENTOLIN HFA) 108 (90 BASE) MCG/ACT inhaler Inhale 2 puffs into the lungs every 6 (six) hours as needed.      Marland Kitchen aspirin 325 MG tablet Take 325 mg by mouth daily.      Marland Kitchen buPROPion (WELLBUTRIN XL) 150 MG 24 hr tablet Take 1 tablet (150 mg total) by mouth daily after breakfast.  30 tablet  3  . cetirizine (ZYRTEC) 10 MG tablet Take 10 mg by mouth daily.      . cholecalciferol (VITAMIN D) 1000 UNITS tablet Take 1,000 Units by mouth daily.      Marland Kitchen docusate sodium (COLACE) 100 MG capsule  Take 100 mg by mouth every morning.      Marland Kitchen guaiFENesin (MUCINEX) 600 MG 12 hr tablet Take 600 mg by mouth 2 (two) times daily.      . hydrochlorothiazide (HYDRODIURIL) 25 MG tablet Take 25 mg by mouth daily.      . hydrOXYzine (ATARAX/VISTARIL) 50 MG tablet Take 50 mg by mouth 3 (three) times daily as needed.       . insulin aspart (NOVOLOG) 100 UNIT/ML injection Inject 2 Units into the skin 3 (three) times daily before meals.      . lamoTRIgine (LAMICTAL) 100 MG tablet Take 50 mg by mouth 2 (two) times daily.      Marland Kitchen LORazepam (ATIVAN) 1 MG tablet Take 1 tab orally in the morning and one half tab in the afternoon after lunch.  45 tablet  2  . losartan (COZAAR) 100 MG tablet Take 100 mg by mouth daily.      . memantine (NAMENDA) 5 MG tablet Take 1 tablet (5 mg total) by mouth daily.  30 tablet  3  . metoprolol succinate (TOPROL-XL) 100 MG 24 hr tablet Take 100 mg by mouth daily. Take with or immediately following a meal.      . potassium chloride SA (K-DUR,KLOR-CON) 20 MEQ tablet Take 20 mEq by mouth every morning.      . insulin detemir (LEVEMIR) 100 UNIT/ML injection Inject 10 Units into the skin at bedtime.      . Linaclotide 145 MCG CAPS Take 145 mg by mouth every morning.      . naproxen (NAPROSYN) 500 MG tablet Take 500 mg by mouth daily as needed.      Marland Kitchen omeprazole (PRILOSEC) 20 MG capsule Take 20 mg by mouth daily.      . risperiDONE (RISPERDAL) 2 MG tablet Take one tablet By mouth  before bedtime  30 tablet  3  . Tamsulosin HCl (FLOMAX) 0.4 MG CAPS Take 0.4 mg by mouth 2 (two) times daily.       Previous Psychotropic Medications: Reviewed  Medication  Dose   Risperdal  3 mg   Ativan  2 12 mg   Wellbutrin XL  150 mg   Lamictal  100 mg   Ambien  10 mg    Substance Abuse History in the last 12 months: Reviewed  Caffeine: Patient reports rare use  Nicotine: Cigarettes-1/2 PPD  Alcohol: Patient denies  Illicit Substances: Patient denies.   Medical Consequences of Substance Abuse: 0  Legal Consequences of Substance Abuse: 0  Family Consequences of Substance Abuse: 0  Blackouts: No  DT's: No   Withdrawal Symptoms: No None   Social History: Reviewed  Current Place of Residence: [place]  Place of Birth: [place]  Family Members: 8  Marital Status: Divorced  Children: 3  Sons:    Daughters:  Relationships: sister and brother in Social worker  Education: Acupuncturist Problems/Performance: never worked as Public affairs consultant  Religious Beliefs/Practices: 0  History of Abuse: none  Armed forces technical officer;  Hotel manager History: Banker History: 0  Hobbies/Interests: 0   Family History: History reviewed. No pertinent family history.   Mental Status Examination/Evaluation: Reviewed  Objective: Appearance: Malodorous   Eye Contact:: Fair   Speech: Slow   Volume: Decreased   Mood: "good"   Affect: Blunt   Thought Process: Logical   Orientation: Full   Thought Content: 0   Suicidal Thoughts: No   Homicidal Thoughts: No   Judgement: Impaired   Insight: Fair  Psychomotor Activity: Decreased   Akathisia: No   Handed: Right   Memory: Immediate 3/3 , recent- 3/3   AIMS (if indicated): See Chart   Assets: Financial Resources/Insurance  Housing  Social Support    Laboratory/X-Ray   No recent labs   Assessment:  AXIS I  Dementia, NOS   AXIS II  No Diagnosis.   AXIS III  Diabetes Mellitus, Type II; Hypertension   AXIS IV  problems related to social environment   AXIS V  51-60 moderate symptoms   Treatment Plan/Recommendations:   PLAN:  1. Affirm with the patient that the medications are taken as ordered. Patient  expressed understanding of how their medications were to be used.  2. Continue the following psychiatric medications as written prior to this appointment:  a) Continue bupropion XL 150 mg daily.  b) Continue Risperidone 2 mg QHS.  c) Continue memantine 5 mg daily  3. Therapy: brief supportive therapy provided. Advised patient to attempt to take part in social event and going on trips with the living facility groups and his family as part of maintaining his overall health along with continuing his daily walks as tolerated. 4. Risks and benefits, side effects and alternatives discussed with patient, he and his sister was given an  opportunity to ask questions about his medication, illness, and treatment. All current psychiatric  medications have been reviewed and discussed with the patient and adjusted as clinically appropriate. The patient has been provided an accurate and updated list of the medications being now prescribed.  5. Patient told to call clinic if any problems occur. Patient advised to go to ER if she should develop SI/HI, side effects, or if symptoms worsen.  6. No labs warranted at this time.  7. The patient was encouraged to keep all PCP and specialty clinic appointments.  8. Patient was instructed to return to clinic in 3 months.  9. The patient's sister expressed understanding of the plan and agrees with the above.   Jacqulyn Cane, MD

## 2012-05-02 ENCOUNTER — Ambulatory Visit (HOSPITAL_COMMUNITY): Payer: Self-pay | Admitting: Psychiatry

## 2012-05-08 ENCOUNTER — Encounter (HOSPITAL_COMMUNITY): Payer: Self-pay | Admitting: Psychiatry

## 2012-05-08 ENCOUNTER — Ambulatory Visit (INDEPENDENT_AMBULATORY_CARE_PROVIDER_SITE_OTHER): Payer: Medicare Other | Admitting: Psychiatry

## 2012-05-08 VITALS — BP 123/82 | HR 72 | Ht 67.0 in | Wt 177.0 lb

## 2012-05-08 DIAGNOSIS — F0390 Unspecified dementia without behavioral disturbance: Secondary | ICD-10-CM

## 2012-05-08 DIAGNOSIS — F329 Major depressive disorder, single episode, unspecified: Secondary | ICD-10-CM

## 2012-05-08 MED ORDER — BUPROPION HCL ER (XL) 150 MG PO TB24: 150.0000 mg | ORAL_TABLET | Freq: Every day | ORAL | Status: DC

## 2012-05-08 MED ORDER — MEMANTINE HCL 5 MG PO TABS: 5.0000 mg | ORAL_TABLET | Freq: Every day | ORAL | Status: DC

## 2012-05-08 MED ORDER — RISPERIDONE 2 MG PO TABS: ORAL_TABLET | ORAL | Status: DC

## 2012-05-08 NOTE — Progress Notes (Signed)
[facility] Follow-up Outpatient Visit  Antonio Flynn [birth year]  Date:  05/08/2012  HPI:   Antonio Flynn is a 72 Y/o male with a past psychiatric history significant for Dementia, NOS. The patient is referred for psychiatric services for medication management.   The patient reports he is bored. No behavioral issues reported by staff, other than sedation.  The patient denies any current stressors. He is here today with his brother. His brother sees the patient once a month. The patient's brother wishes that the patient was more social and would come out more often but denies any further concerns.  The patient's living facility was contacted, they deny any concerns for the patient and report that his behavior is good.  In the area of affective symptoms, patient appears euthymic. Patient denies current suicidal ideation, intent, or plan. Patient denies current homicidal ideation, intent, or plan. Patient denies auditory hallucinations. Patient denies visual hallucinations. Patient denies symptoms of paranoia. Patient states sleep is good, with approximately 9 hours daily. Appetite is decreased. Energy level is fair. Patient denies symptoms of anhedonia. Patient denies hopelessness, helplessness, or guilt.   Denies any recent episodes consistent with mania, particularly decreased need for sleep with increased energy, grandiosity, impulsivity, hyperverbal and pressured speech, or increased productivity. Denies any recent symptoms consistent with psychosis, particularly auditory or visual hallucinations, thought broadcasting/insertion/withdrawal, or ideas of reference. Also denies excessive worry to the point of physical symptoms as well as any panic attacks. Denies any history of trauma or symptoms consistent with PTSD such as flashbacks, nightmares, hypervigilance, feelings of numbness or inability to connect with others.   Review of Systems  Constitutional: . Negative for fever, chills,  weight loss and diaphoresis.  HENT: Negative. Respiratory: Negative.  Cardiovascular: Negative.  Gastrointestinal: Negative.  Neurological: Negative for weakness.   Filed Vitals:   05/08/12 1318  BP: 123/82  Pulse: 72  Height: 5\' 7"  (1.702 m)  Weight: 177 lb (80.287 kg)   Physical Exam  Vitals reviewed.  Constitutional: He appears well-developed and well-nourished. No distress.  Skin: He is not diaphoretic.   Past Psychiatric History: Reviewed  Diagnosis: Depression, Dementia   Hospitalizations: One previous psychiatric hospitalization   Outpatient Care: assisted living   Substance Abuse Care: no   Self-Mutilation: no   Suicidal Attempts: none   Violent Behaviors: only "irritable" -verbal aggressiveness in the past prior to risperidone    Past Medical History: Reviewed  Past Medical History   Diagnosis  Date   .  Depression    .  Dementia      may have had a stroke    History of Loss of Consciousness: Yes  Seizure History: No  Cardiac History: Yes   Allergies: Reviewed  Allergies   Allergen  Reactions   .  Penicillins     Current Medications: Reviewed  Current Outpatient Prescriptions on File Prior to Visit  Medication Sig Dispense Refill  . albuterol (PROVENTIL HFA;VENTOLIN HFA) 108 (90 BASE) MCG/ACT inhaler Inhale 2 puffs into the lungs every 6 (six) hours as needed.      Marland Kitchen aspirin 325 MG tablet Take 325 mg by mouth daily.      Marland Kitchen buPROPion (WELLBUTRIN XL) 150 MG 24 hr tablet Take 1 tablet (150 mg total) by mouth daily after breakfast.  30 tablet  3  . cetirizine (ZYRTEC) 10 MG tablet Take 10 mg by mouth daily.      . cholecalciferol (VITAMIN D) 1000 UNITS tablet Take 1,000 Units by mouth  daily.      . docusate sodium (COLACE) 100 MG capsule Take 100 mg by mouth every morning.      Marland Kitchen guaiFENesin (MUCINEX) 600 MG 12 hr tablet Take 600 mg by mouth 2 (two) times daily.      . hydrochlorothiazide (HYDRODIURIL) 25 MG tablet Take 25 mg by mouth daily.      .  hydrOXYzine (ATARAX/VISTARIL) 50 MG tablet Take 50 mg by mouth 3 (three) times daily as needed.      . insulin aspart (NOVOLOG) 100 UNIT/ML injection Inject 2 Units into the skin 3 (three) times daily before meals.      . insulin detemir (LEVEMIR) 100 UNIT/ML injection Inject 10 Units into the skin at bedtime.      . lamoTRIgine (LAMICTAL) 100 MG tablet Take 50 mg by mouth 2 (two) times daily.      . Linaclotide 145 MCG CAPS Take 145 mg by mouth every morning.      Marland Kitchen LORazepam (ATIVAN) 1 MG tablet Take 1 tab orally in the morning and one half tab in the afternoon after lunch.  45 tablet  2  . losartan (COZAAR) 100 MG tablet Take 100 mg by mouth daily.      . memantine (NAMENDA) 5 MG tablet Take 1 tablet (5 mg total) by mouth daily.  30 tablet  3  . metoprolol succinate (TOPROL-XL) 100 MG 24 hr tablet Take 100 mg by mouth daily. Take with or immediately following a meal.      . naproxen (NAPROSYN) 500 MG tablet Take 500 mg by mouth daily as needed.      Marland Kitchen omeprazole (PRILOSEC) 20 MG capsule Take 20 mg by mouth daily.      . potassium chloride SA (K-DUR,KLOR-CON) 20 MEQ tablet Take 20 mEq by mouth every morning.      . risperiDONE (RISPERDAL) 2 MG tablet Take one tablet By mouth  before bedtime  30 tablet  3  . Tamsulosin HCl (FLOMAX) 0.4 MG CAPS Take 0.4 mg by mouth 2 (two) times daily.       Previous Psychotropic Medications: Reviewed  Medication  Dose   Risperdal  3 mg   Ativan  2 12 mg   Wellbutrin XL  150 mg   Lamictal  100 mg   Ambien  10 mg    Substance Abuse History in the last 12 months: Reviewed  Caffeine: Patient reports rare use  Nicotine: Cigarettes-less 1/2 PPD  Alcohol: Patient denies  Illicit Substances: Patient denies.   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 No pertinent family history.   Mental Status Examination/Evaluation: Reviewed  Objective: Appearance: Malodorous   Eye Contact:: Fair   Speech: Slow   Volume: Decreased   Mood: "fine" (0=very depressed; 5=Neutral; 10=Very happy)  Affect: Blunt   Thought Process: Logical   Orientation: Full   Thought Content: 0  Suicidal Thoughts: No   Homicidal Thoughts: No   Judgement: Impaired   Insight: Fair   Psychomotor Activity: Decreased   Akathisia: No   Handed: Right   Memory: Immediate 3/3 , recent- 2/3   AIMS (if indicated): See Chart   Assets: Financial Resources/Insurance  Housing  Social Support    Laboratory/X-Ray   No recent labs   Assessment:  AXIS I  Dementia, NOS   AXIS II  No Diagnosis.   AXIS III  Diabetes Mellitus, Type II; Hypertension   AXIS IV  problems related to social environment   AXIS V  51-60 moderate symptoms   Treatment Plan/Recommendations:   PLAN:  1. Affirm with the patient that the medications are taken as ordered. Patient  expressed understanding of how their medications were to be used.  2. Continue the following psychiatric medications as written prior to this appointment:  a) Continue bupropion XL 150 mg daily.  b) Continue Risperidone 2 mg QHS.  c) Continue memantine 5 mg daily  D) Recommend further reduction of  Lorazepam from  0.5 mg BID, if possible. 3. Therapy: brief supportive therapy provided. Advised patient to attempt to take part in social event and going on trips with the living facility  groups and his family as part of maintaining his overall health along with continuing his daily walks as tolerated. 4. Risks and benefits, side effects and alternatives discussed with patient, he and his sister was given an opportunity to ask questions about his medication, illness, and treatment. All current psychiatric medications have been reviewed and discussed with the patient and adjusted as clinically appropriate. The patient has been provided an accurate and updated list of the medications being now prescribed.  5. Patient told to call clinic if any problems occur. Patient advised to go to ER if she should develop SI/HI, side effects, or if symptoms worsen.  6. Labs followed by PCP. 7. The patient was encouraged to keep all PCP and specialty clinic appointments.  8. Patient was instructed to return to clinic in 3 months.  9. The patient's sister expressed understanding of the plan and agrees with the above.   Jacqulyn Cane, MD

## 2012-08-06 ENCOUNTER — Ambulatory Visit (HOSPITAL_COMMUNITY): Payer: Self-pay | Admitting: Psychiatry

## 2012-08-09 ENCOUNTER — Encounter (HOSPITAL_COMMUNITY): Payer: Self-pay | Admitting: Psychiatry

## 2012-08-09 ENCOUNTER — Ambulatory Visit (INDEPENDENT_AMBULATORY_CARE_PROVIDER_SITE_OTHER): Payer: Medicare Other | Admitting: Psychiatry

## 2012-08-09 VITALS — BP 116/78 | HR 82 | Ht 67.0 in | Wt 171.0 lb

## 2012-08-09 DIAGNOSIS — F329 Major depressive disorder, single episode, unspecified: Secondary | ICD-10-CM

## 2012-08-09 DIAGNOSIS — F0390 Unspecified dementia without behavioral disturbance: Secondary | ICD-10-CM

## 2012-08-09 MED ORDER — MEMANTINE HCL 5 MG PO TABS: 5.0000 mg | ORAL_TABLET | Freq: Every day | ORAL | Status: DC

## 2012-08-09 MED ORDER — BUPROPION HCL ER (XL) 150 MG PO TB24: 150.0000 mg | ORAL_TABLET | Freq: Every day | ORAL | Status: DC

## 2012-08-09 MED ORDER — RISPERIDONE 2 MG PO TABS: ORAL_TABLET | ORAL | Status: DC

## 2012-08-09 NOTE — Progress Notes (Signed)
[facility] Follow-up Outpatient Visit   [facility] Follow-up Outpatient Visit  Antonio Flynn [birth year]  Date:  08/09/2012  HPI:   Antonio Flynn is a 72  Y/o male with a past psychiatric history significant for Dementia, NOS. The patient is referred for psychiatric services for medication management.   The patient reports he is doing well. He states he is taking his medications and denies any side effects. His sister is here today to provide collateral information. She states the patient is doing well and denies any concerns.   The patient denies any current stressors. He is here today with his brother. His brother sees the patient once a month. The patient's brother wishes that the patient was more social and would come out more often but denies any further concerns.  The patient's living facility was contacted, they deny any concerns for the patient and report that his behavior is good.  In the area of affective symptoms, patient appears euthymic. Patient denies current suicidal ideation, intent, or plan. Patient denies current homicidal ideation, intent, or plan. Patient denies auditory hallucinations. Patient denies visual hallucinations. Patient denies symptoms of paranoia. Patient states sleep is good, with approximately 9 hours daily. Appetite is okay. Energy level is fair. Patient denies symptoms of anhedonia. Patient denies hopelessness, helplessness, or guilt.   Denies any recent episodes consistent with mania, particularly decreased need for sleep with increased energy, grandiosity, impulsivity, hyperverbal and pressured speech, or increased productivity. Denies any recent symptoms consistent with psychosis, particularly auditory or visual hallucinations, thought broadcasting/insertion/withdrawal, or ideas of reference. Also denies excessive worry to the point of physical symptoms as well as any panic attacks. Denies any history of trauma or symptoms consistent  with PTSD such as flashbacks, nightmares, hypervigilance, feelings of numbness or inability to connect with others.   Review of Systems  Constitutional: Negative for fever, chills and weight loss.  Respiratory: Negative for cough, sputum production, shortness of breath and wheezing.   Cardiovascular: Negative for chest pain, palpitations and leg swelling.  Gastrointestinal: Negative for heartburn, nausea, vomiting, abdominal pain, diarrhea and constipation.  Neurological: Negative for weakness.     Filed Vitals:   08/09/12 1507  BP: 116/78  Pulse: 82  Height: 5\' 7"  (1.702 m)  Weight: 171 lb (77.565 kg)   Physical Exam  Vitals reviewed.  Constitutional: He appears well-developed and well-nourished. No distress.  Skin: He is not diaphoretic.  Musculoskeletal: Strength & Muscle Tone: within normal limits Gait & Station: normal Patient leans: N/A   Past Psychiatric History: Reviewed  Diagnosis: Depression, Dementia   Hospitalizations: One previous psychiatric hospitalization   Outpatient Care: assisted living   Substance Abuse Care: no   Self-Mutilation: no   Suicidal Attempts: none   Violent Behaviors: only "irritable" -verbal aggressiveness in the past prior to risperidone    Past Medical History: Reviewed  Past Medical History   Diagnosis  Date   .  Depression    .  Dementia      may have had a stroke    History of Loss of Consciousness: Yes  Seizure History: No  Cardiac History: Yes   Allergies: Reviewed  Allergies   Allergen  Reactions   .  Penicillins     Current Medications: Reviewed  Current Outpatient Prescriptions on File Prior to Visit  Medication Sig Dispense Refill  . buPROPion (WELLBUTRIN XL) 150 MG 24 hr tablet Take 1 tablet (150 mg total) by mouth daily after breakfast.  30 tablet  3  . lamoTRIgine (LAMICTAL) 100 MG tablet Take 50 mg by mouth 2 (two) times daily.      Marland Kitchen LORazepam (ATIVAN) 1 MG tablet Take 1 tab orally in the morning and one half  tab in the afternoon after lunch.  45 tablet  2  . risperiDONE (RISPERDAL) 2 MG tablet Take one tablet By mouth  before bedtime  30 tablet  3  . albuterol (PROVENTIL HFA;VENTOLIN HFA) 108 (90 BASE) MCG/ACT inhaler Inhale 2 puffs into the lungs every 6 (six) hours as needed.      Marland Kitchen aspirin 325 MG tablet Take 325 mg by mouth daily.      . cetirizine (ZYRTEC) 10 MG tablet Take 10 mg by mouth daily.      . cholecalciferol (VITAMIN D) 1000 UNITS tablet Take 1,000 Units by mouth daily.      Marland Kitchen docusate sodium (COLACE) 100 MG capsule Take 100 mg by mouth every morning.      Marland Kitchen guaiFENesin (MUCINEX) 600 MG 12 hr tablet Take 600 mg by mouth 2 (two) times daily.      . hydrochlorothiazide (HYDRODIURIL) 25 MG tablet Take 25 mg by mouth daily.      . insulin aspart (NOVOLOG) 100 UNIT/ML injection Inject 2 Units into the skin 3 (three) times daily before meals.      . insulin detemir (LEVEMIR) 100 UNIT/ML injection Inject 10 Units into the skin at bedtime.      . Linaclotide 145 MCG CAPS Take 145 mg by mouth every morning.      Marland Kitchen losartan (COZAAR) 100 MG tablet Take 100 mg by mouth daily.      . memantine (NAMENDA) 5 MG tablet Take 1 tablet (5 mg total) by mouth daily.  30 tablet  3  . metoprolol succinate (TOPROL-XL) 100 MG 24 hr tablet Take 100 mg by mouth daily. Take with or immediately following a meal.      . naproxen (NAPROSYN) 500 MG tablet Take 500 mg by mouth daily as needed.      Marland Kitchen omeprazole (PRILOSEC) 20 MG capsule Take 20 mg by mouth daily.      . potassium chloride SA (K-DUR,KLOR-CON) 20 MEQ tablet Take 20 mEq by mouth every morning.      . Tamsulosin HCl (FLOMAX) 0.4 MG CAPS Take 0.4 mg by mouth 2 (two) times daily.       No current facility-administered medications on file prior to visit.   Previous Psychotropic Medications: Reviewed  Medication  Dose   Risperdal  3 mg   Ativan  2 12 mg   Wellbutrin XL  150 mg   Lamictal  100 mg   Ambien  10 mg    Substance Abuse History in the last 12  months: Reviewed  Caffeine: Patient reports rare use  Nicotine: Cigarettes-less 1/2 PPD  Alcohol: Patient denies  Illicit Substances: Patient denies.   Medical Consequences of Substance Abuse: 0  Legal Consequences of Substance Abuse: 0  Family Consequences of Substance Abuse: 0  Blackouts: No  DT's: No   Withdrawal Symptoms: No None   Social History: Reviewed  Current Place of Residence: [place]  Place of Birth: [place]  Family Members: 8  Marital Status: Divorced  Children: 3  Sons:  Daughters:  Relationships: sister and brother in Social worker  Education: Acupuncturist Problems/Performance: never worked as Public affairs consultant  Religious Beliefs/Practices: 0  History of Abuse: none  Armed forces technical officer;  Hotel manager History: Banker History: 0  Hobbies/Interests: 0   Family History: History reviewed. No pertinent family history.   Mental Status Examination/Evaluation: Reviewed  Objective: Appearance: Malodorous   Eye Contact:: Fair   Speech: Slow   Volume: Decreased   Mood: "fine" 7/10 (0=very depressed; 5=Neutral; 10=Very happy)  Affect: Blunt   Thought Process: Logical   Orientation: Full   Thought Content: 0   Suicidal Thoughts: No   Homicidal Thoughts: No   Judgement: Impaired   Insight: Fair   Psychomotor Activity: Decreased   Akathisia: No   Handed: Right   Memory: Immediate 3/3 , recent- 2/3   AIMS (if indicated): See Chart   Assets: Financial Resources/Insurance  Housing  Social Support    Laboratory/X-Ray   No recent labs   Assessment:  AXIS I  Dementia, NOS   AXIS II  No Diagnosis.   AXIS III  Diabetes Mellitus, Type II; Hypertension   AXIS IV  problems related to social environment   AXIS V  51-60 moderate symptoms   Treatment Plan/Recommendations:   PLAN:  1. Affirm with the patient that the medications are taken as ordered. Patient  expressed understanding of how their medications were to be used.  2. Continue the  following psychiatric medications as written prior to this appointment:  a) Continue bupropion XL 150 mg daily.  b) Continue Risperidone 2 mg QHS.  c) Continue memantine 5 mg daily  D) Recommend reduction of  Lorazepam from  1 mg BID, if possible. I have not prescribed this medication.  3. Therapy: brief supportive therapy provided. Advised patient to attempt to take part in social event and going on trips with the living facility groups and his family as part of maintaining his overall health along with continuing his daily walks as tolerated. 4. Risks and benefits, side effects and alternatives discussed with patient, he and his sister was given an opportunity to ask questions about his medication, illness, and treatment. All current psychiatric medications have been reviewed and discussed with the patient and adjusted as clinically appropriate. The patient has been provided an accurate and updated list of the medications being now prescribed.  5. Patient told to call clinic if any problems occur. Patient advised to go to ER if she should develop SI/HI, side effects, or if symptoms worsen.  6. Labs followed by PCP. 7. The patient was encouraged to keep all PCP and specialty clinic appointments.  8. Patient was instructed to return to clinic in 3 months.  9. The patient's sister expressed understanding of the plan and agrees with the above.   Jacqulyn Cane, M.D.  08/09/2012 3:04 PM

## 2012-12-10 ENCOUNTER — Ambulatory Visit (HOSPITAL_COMMUNITY): Payer: Self-pay | Admitting: Psychiatry

## 2012-12-11 ENCOUNTER — Ambulatory Visit (HOSPITAL_COMMUNITY): Payer: Self-pay | Admitting: Psychiatry

## 2013-01-01 ENCOUNTER — Ambulatory Visit (HOSPITAL_COMMUNITY): Payer: Self-pay | Admitting: Psychiatry

## 2013-01-03 ENCOUNTER — Ambulatory Visit (INDEPENDENT_AMBULATORY_CARE_PROVIDER_SITE_OTHER): Payer: Medicare Other | Admitting: Psychiatry

## 2013-01-03 ENCOUNTER — Encounter (HOSPITAL_COMMUNITY): Payer: Self-pay | Admitting: Psychiatry

## 2013-01-03 VITALS — BP 119/79 | HR 75 | Ht 67.0 in | Wt 171.0 lb

## 2013-01-03 DIAGNOSIS — F0391 Unspecified dementia with behavioral disturbance: Secondary | ICD-10-CM

## 2013-01-03 DIAGNOSIS — F329 Major depressive disorder, single episode, unspecified: Secondary | ICD-10-CM

## 2013-01-03 DIAGNOSIS — F0390 Unspecified dementia without behavioral disturbance: Secondary | ICD-10-CM

## 2013-01-03 MED ORDER — MEMANTINE HCL 5 MG PO TABS: 5.0000 mg | ORAL_TABLET | Freq: Every day | ORAL | Status: DC

## 2013-01-03 MED ORDER — RISPERIDONE 2 MG PO TABS: ORAL_TABLET | ORAL | Status: DC

## 2013-01-03 MED ORDER — BUPROPION HCL ER (XL) 150 MG PO TB24: 150.0000 mg | ORAL_TABLET | Freq: Every day | ORAL | Status: DC

## 2013-01-03 MED ORDER — LORAZEPAM 1 MG PO TABS: 0.5000 mg | ORAL_TABLET | Freq: Two times a day (BID) | ORAL | Status: DC | PRN

## 2013-01-03 NOTE — Progress Notes (Signed)
[facility] Behavioral Health Follow-up Outpatient Visit  Antonio Flynn [birth year]  Date:  01/03/2013  HPI:   Antonio Flynn is a 72  Y/o male with a past psychiatric history significant for Dementia, NOS. The patient is referred for psychiatric services for medication management.   Elements:  Location:The patient reports continued problems with his memory-he reports getting lost of he goes to a part of his residential facility that he does not know.  Quality: Stressors including: Continued worsening of memory.  The patient reports he is doing well. He states he is taking his medications and denies any side effects. His sister is here today to provide collateral information. She states the patient is doing well and denies any concerns.   The patient denies any current stressors. He is here today with his sister. She reports that the patient is doing well .   In the area of affective symptoms, patient appears euthymic. Patient denies current suicidal ideation, intent, or plan. Patient denies current homicidal ideation, intent, or plan. Patient denies auditory hallucinations. Patient denies visual hallucinations. Patient denies symptoms of paranoia. Patient states sleep is good, with approximately 9 hours daily. Appetite is okay. Energy level is fair. Patient denies symptoms of anhedonia. Patient denies hopelessness, helplessness, or guilt.  Severity: Depression: 5/10 (0=Very depressed; 5=Neutral; 10=Very Happy)  Anxiety- 0/10 (0=no anxiety; 5= moderate/tolerable anxiety; 10= panic attacks)  Timing: No symptoms currently. Symptoms have improved ov the past year. Duration: 3 years.  Context: None Associalted symtpoms:  Denies any recent episodes consistent with mania, particularly decreased need for sleep with increased energy, grandiosity, impulsivity, hyperverbal and pressured speech, or increased productivity. Denies any recent symptoms consistent with psychosis, particularly auditory or visual  hallucinations, thought broadcasting/insertion/withdrawal, or ideas of reference. Also denies excessive worry to the point of physical symptoms as well as any panic attacks. Denies any history of trauma or symptoms consistent with PTSD such as flashbacks, nightmares, hypervigilance, feelings of numbness or inability to connect with others.   Review of Systems  Constitutional: Negative for fever, chills and weight loss.  Respiratory: Negative for cough, sputum production, shortness of breath and wheezing.   Cardiovascular: Negative for chest pain, palpitations and leg swelling.  Gastrointestinal: Negative for heartburn, nausea, vomiting, abdominal pain, diarrhea and constipation.  Neurological: Negative for weakness.   Filed Vitals:   01/03/13 1528  BP: 119/79  Pulse: 75  Height: 5\' 7"  (1.702 m)  Weight: 171 lb (77.565 kg)   Physical Exam  Vitals reviewed.  Constitutional: He appears well-developed and well-nourished. No distress.  Skin: He is not diaphoretic.  Musculoskeletal: Strength & Muscle Tone: within normal limits Gait & Station: normal Patient leans: N/A   Past Psychiatric History: Reviewed  Diagnosis: Depression, Dementia   Hospitalizations: One previous psychiatric hospitalization   Outpatient Care: assisted living   Substance Abuse Care: no   Self-Mutilation: no   Suicidal Attempts: none   Violent Behaviors: only "irritable" -verbal aggressiveness in the past prior to risperidone    Past Medical History: Reviewed  Past Medical History   Diagnosis  Date   .  Depression    .  Dementia      may have had a stroke    History of Loss of Consciousness: Yes  Seizure History: No  Cardiac History: Yes   Allergies: Reviewed  Allergies   Allergen  Reactions   .  Penicillins     Current Medications: Reviewed  Current Outpatient Prescriptions on File Prior to Visit  Medication Sig Dispense Refill  .  albuterol (PROVENTIL HFA;VENTOLIN HFA) 108 (90 BASE) MCG/ACT  inhaler Inhale 2 puffs into the lungs every 6 (six) hours as needed.      Marland Kitchen aspirin 325 MG tablet Take 325 mg by mouth daily.      Marland Kitchen buPROPion (WELLBUTRIN XL) 150 MG 24 hr tablet Take 1 tablet (150 mg total) by mouth daily after breakfast.  30 tablet  3  . cetirizine (ZYRTEC) 10 MG tablet Take 10 mg by mouth daily.      . cholecalciferol (VITAMIN D) 1000 UNITS tablet Take 1,000 Units by mouth daily.      Marland Kitchen docusate sodium (COLACE) 100 MG capsule Take 100 mg by mouth every morning.      Marland Kitchen guaiFENesin (MUCINEX) 600 MG 12 hr tablet Take 600 mg by mouth 2 (two) times daily.      . hydrochlorothiazide (HYDRODIURIL) 25 MG tablet Take 25 mg by mouth daily.      . insulin aspart (NOVOLOG) 100 UNIT/ML injection Inject 2 Units into the skin 3 (three) times daily before meals.      . insulin detemir (LEVEMIR) 100 UNIT/ML injection Inject 10 Units into the skin at bedtime.      . lamoTRIgine (LAMICTAL) 100 MG tablet Take 50 mg by mouth 2 (two) times daily.      . Linaclotide 145 MCG CAPS Take 145 mg by mouth every morning.      Marland Kitchen LORazepam (ATIVAN) 1 MG tablet Take 1 mg by mouth 2 (two) times daily as needed. Take 1 tab orally in the morning and one half tab in the afternoon after lunch.      . losartan (COZAAR) 100 MG tablet Take 100 mg by mouth daily.      . memantine (NAMENDA) 5 MG tablet Take 1 tablet (5 mg total) by mouth daily.  30 tablet  3  . metoprolol succinate (TOPROL-XL) 100 MG 24 hr tablet Take 100 mg by mouth daily. Take with or immediately following a meal.      . naproxen (NAPROSYN) 500 MG tablet Take 500 mg by mouth daily as needed.      Marland Kitchen omeprazole (PRILOSEC) 20 MG capsule Take 20 mg by mouth daily.      . potassium chloride SA (K-DUR,KLOR-CON) 20 MEQ tablet Take 20 mEq by mouth every morning.      . risperiDONE (RISPERDAL) 2 MG tablet Take one tablet By mouth  before bedtime  30 tablet  3  . Tamsulosin HCl (FLOMAX) 0.4 MG CAPS Take 0.4 mg by mouth 2 (two) times daily.       No current  facility-administered medications on file prior to visit.   Previous Psychotropic Medications: Reviewed  Medication  Dose   Risperdal  3 mg   Ativan  2 12 mg   Wellbutrin XL  150 mg   Lamictal  100 mg   Ambien  10 mg    Substance Abuse History in the last 12 months: Reviewed  Caffeine: Patient reports rare use  Nicotine: Cigarettes-less 1/2 PPD  Alcohol: Patient denies  Illicit Substances: Patient denies.   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 No pertinent family history.   Mental Status Examination/Evaluation: Reviewed  Objective: Appearance: Malodorous   Eye Contact:: Fair   Speech: Slow   Volume: Decreased   Mood: "fine" Depression: 5/10 (0=Very depressed; 5=Neutral; 10=Very Happy)  Anxiety- 0/10 (0=no anxiety; 5= moderate/tolerable anxiety; 10= panic attacks)  Affect: Blunt   Thought Process: Logical   Orientation: Full   Thought Content: 0   Suicidal Thoughts: No   Homicidal Thoughts: No   Judgement: Impaired   Insight: Fair   Psychomotor Activity: Decreased   Akathisia: No   Handed: Right   Memory: Immediate 3/3 , recent- 2/3   AIMS (if indicated): See Chart   Assets: Financial Resources/Insurance  Housing  Social Support    Laboratory/X-Ray   No recent labs   Assessment:  AXIS I  Dementia, NOS   AXIS II  No  Diagnosis.   AXIS III  Diabetes Mellitus, Type II; Hypertension   AXIS IV  problems related to social environment   AXIS V  51-60 moderate symptoms   Treatment Plan/Recommendations:   PLAN:  1. Affirm with the patient that the medications are taken as ordered. Patient  expressed understanding of how their medications were to be used.  2. Continue the following psychiatric medications as written prior to this appointment:  a) Continue bupropion XL 150 mg daily. b) Continue Risperidone 2 mg QHS.  c) Increase memantine 5 mg BID  daily  D) Recommend further reduction of  Lorazepam from  0.5 mg BID, if possible. I have not prescribed this medication.  3. Therapy: brief supportive therapy provided. Advised patient to attempt to take part in social event and going on trips with the living facility groups and his family as part of maintaining his overall health along with continuing his daily walks as tolerated. 4. Risks and benefits, side effects and alternatives discussed with patient, he and his sister was given an opportunity to ask questions about his medication, illness, and treatment. All current psychiatric medications have been reviewed and discussed with the patient and adjusted as clinically appropriate. The patient has been provided an accurate and updated list of the medications being now prescribed.  5. Patient told to call clinic if any problems occur. Patient advised to go to ER if she should develop SI/HI, side effects, or if symptoms worsen.  6. Labs followed by PCP. 7. The patient was encouraged to keep all PCP and specialty clinic appointments.  8. Patient was instructed to return to clinic in 2 months.  9. The patient's sister expressed understanding of the plan and agrees with the above.   Jacqulyn Cane, M.D.  01/03/2013 3:27 PM

## 2013-01-14 ENCOUNTER — Telehealth (HOSPITAL_COMMUNITY): Payer: Self-pay

## 2013-01-14 NOTE — Telephone Encounter (Signed)
Patient's sister reports that she has reported that the patient asked about his lorazepam being reduced. No reported behavioral issues, side effects or withdrawal symptoms.

## 2013-03-05 ENCOUNTER — Encounter (HOSPITAL_COMMUNITY): Payer: Self-pay | Admitting: Psychiatry

## 2013-03-05 ENCOUNTER — Ambulatory Visit (INDEPENDENT_AMBULATORY_CARE_PROVIDER_SITE_OTHER): Payer: Medicare Other | Admitting: Psychiatry

## 2013-03-05 VITALS — BP 125/77 | HR 89 | Ht 67.0 in | Wt 172.0 lb

## 2013-03-05 DIAGNOSIS — F329 Major depressive disorder, single episode, unspecified: Secondary | ICD-10-CM

## 2013-03-05 DIAGNOSIS — F09 Unspecified mental disorder due to known physiological condition: Secondary | ICD-10-CM

## 2013-03-05 DIAGNOSIS — F0281 Dementia in other diseases classified elsewhere with behavioral disturbance: Secondary | ICD-10-CM

## 2013-03-05 DIAGNOSIS — F919 Conduct disorder, unspecified: Secondary | ICD-10-CM

## 2013-03-05 MED ORDER — BUPROPION HCL ER (XL) 150 MG PO TB24: 150.0000 mg | ORAL_TABLET | Freq: Every day | ORAL | Status: DC

## 2013-03-05 MED ORDER — RISPERIDONE 2 MG PO TABS: ORAL_TABLET | ORAL | Status: DC

## 2013-03-05 NOTE — Progress Notes (Signed)
[facility] Behavioral Health Follow-up Outpatient Visit  Amjad Fikes 07/14/1940  Date:  03/05/2013  HPI:   Mr. Vaquera is a 72  Y/o male with a past psychiatric history significant for Dementia, NOS. The patient is referred for psychiatric services for medication management.   Elements:  Location: The patient reports that he is doing well.  Quality: The patient denies any current identifiable stressors.   The patient reports he is doing well. He states he is taking his medications and denies any side effects. His sister is here today to provide collateral information. She states the patient continues to do well and denies any concerns. She tries to get the patient to go out and get him walking when possible.  The patient denies any current stressors. He is here today with his sister. She reports that the patient is doing well .   In the area of affective symptoms, patient appears euthymic. Patient denies current suicidal ideation, intent, or plan. Patient denies current homicidal ideation, intent, or plan. Patient denies auditory hallucinations. Patient denies visual hallucinations. Patient denies symptoms of paranoia. Patient states sleep is good, with approximately 6-7 hours daily. Appetite is okay. Energy level is fair. Patient denies symptoms of anhedonia. Patient denies hopelessness, helplessness, or guilt.   Severity: Depression: 5/10 (0=Very depressed; 5=Neutral; 10=Very Happy)  Anxiety- 0/10 (0=no anxiety; 5= moderate/tolerable anxiety; 10= panic attacks)  Timing: No symptoms currently. Symptoms have improved ov the past year. Duration: Over 3 years.  Context: None Associalted symtpoms:  Denies any recent episodes consistent with mania, particularly decreased need for sleep with increased energy, grandiosity, impulsivity, hyperverbal and pressured speech, or increased productivity. Denies any recent symptoms consistent with psychosis, particularly auditory or visual hallucinations,  thought broadcasting/insertion/withdrawal, or ideas of reference. Also denies excessive worry to the point of physical symptoms as well as any panic attacks. Denies any history of trauma or symptoms consistent with PTSD such as flashbacks, nightmares, hypervigilance, feelings of numbness or inability to connect with others.   Review of Systems  Constitutional: Negative for fever, chills and weight loss.  Respiratory: Negative for cough, sputum production, shortness of breath and wheezing.   Cardiovascular: Negative for chest pain, palpitations and leg swelling.  Gastrointestinal: Negative for heartburn, nausea, vomiting, abdominal pain, diarrhea and constipation.  Neurological: Negative for weakness.   Filed Vitals:   03/05/13 1350  BP: 125/77  Pulse: 89  Height: 5\' 7"  (1.702 m)  Weight: 172 lb (78.019 kg)   Physical Exam  Vitals reviewed.  Constitutional: He appears well-developed and well-nourished. No distress.  Skin: He is not diaphoretic.  Musculoskeletal: Strength & Muscle Tone: within normal limits Gait & Station: normal Patient leans: N/A   Past Psychiatric History: Reviewed  Diagnosis: Depression, Dementia   Hospitalizations: One previous psychiatric hospitalization   Outpatient Care: assisted living   Substance Abuse Care: no   Self-Mutilation: no   Suicidal Attempts: none   Violent Behaviors: only "irritable" -verbal aggressiveness in the past prior to risperidone    Past Medical History: Reviewed  Past Medical History   Diagnosis  Date   .  Depression    .  Dementia      may have had a stroke    History of Loss of Consciousness: Yes  Seizure History: No  Cardiac History: Yes   Allergies: Reviewed  Allergies   Allergen  Reactions   .  Penicillins     Current Medications: Reviewed  Current Outpatient Prescriptions on File Prior to Visit  Medication Sig  Dispense Refill  . albuterol (PROVENTIL HFA;VENTOLIN HFA) 108 (90 BASE) MCG/ACT inhaler Inhale 2  puffs into the lungs every 6 (six) hours as needed.      Marland Kitchen aspirin 325 MG tablet Take 325 mg by mouth daily.      Marland Kitchen buPROPion (WELLBUTRIN XL) 150 MG 24 hr tablet Take 1 tablet (150 mg total) by mouth daily after breakfast.  30 tablet  3  . cetirizine (ZYRTEC) 10 MG tablet Take 10 mg by mouth daily.      . cholecalciferol (VITAMIN D) 1000 UNITS tablet Take 1,000 Units by mouth daily.      Marland Kitchen docusate sodium (COLACE) 100 MG capsule Take 100 mg by mouth every morning.      Marland Kitchen guaiFENesin (MUCINEX) 600 MG 12 hr tablet Take 600 mg by mouth 2 (two) times daily.      . hydrochlorothiazide (HYDRODIURIL) 25 MG tablet Take 25 mg by mouth daily.      . insulin aspart (NOVOLOG) 100 UNIT/ML injection Inject 2 Units into the skin 3 (three) times daily before meals.      . insulin detemir (LEVEMIR) 100 UNIT/ML injection Inject 10 Units into the skin at bedtime.      . lamoTRIgine (LAMICTAL) 100 MG tablet Take 50 mg by mouth 2 (two) times daily.      . Linaclotide 145 MCG CAPS Take 145 mg by mouth every morning.      Marland Kitchen LORazepam (ATIVAN) 1 MG tablet Take 0.5 tablets (0.5 mg total) by mouth 2 (two) times daily as needed.  60 tablet  0  . losartan (COZAAR) 100 MG tablet Take 100 mg by mouth daily.      . memantine (NAMENDA) 5 MG tablet Take 1 tablet (5 mg total) by mouth daily.  60 tablet  3  . metoprolol succinate (TOPROL-XL) 100 MG 24 hr tablet Take 100 mg by mouth daily. Take with or immediately following a meal.      . naproxen (NAPROSYN) 500 MG tablet Take 500 mg by mouth daily as needed.      Marland Kitchen omeprazole (PRILOSEC) 20 MG capsule Take 20 mg by mouth daily.      . potassium chloride SA (K-DUR,KLOR-CON) 20 MEQ tablet Take 20 mEq by mouth every morning.      . risperiDONE (RISPERDAL) 2 MG tablet Take one tablet By mouth  before bedtime  30 tablet  3  . Tamsulosin HCl (FLOMAX) 0.4 MG CAPS Take 0.4 mg by mouth 2 (two) times daily.       No current facility-administered medications on file prior to visit.    Previous Psychotropic Medications: Reviewed  Medication  Dose   Risperdal  3 mg   Ativan  2 12 mg   Wellbutrin XL  150 mg   Lamictal  100 mg   Ambien  10 mg    Substance Abuse History in the last 12 months: Reviewed  Caffeine: Patient reports rare use  Nicotine: Cigarettes-less 1/3 PPD  Alcohol: Patient denies  Illicit Substances: Patient denies.   Medical Consequences of Substance Abuse: 0  Legal Consequences of Substance Abuse: 0  Family Consequences of Substance Abuse: 0  Blackouts: No  DT's: No   Withdrawal Symptoms: No None   Social History: Reviewed  Current Place of Residence: [place]  Place of Birth: [place]  Family Members: 8  Marital Status: Divorced  Children: 3  Sons:  Daughters:  Relationships: sister and brother in Social worker  Education: Acupuncturist Problems/Performance: never  worked as Public affairs consultant  Religious Beliefs/Practices: 0  History of Abuse: none  Armed forces technical officer;  Hotel manager History: Banker History: 0  Hobbies/Interests: 0   Family History: History reviewed. No pertinent family history.   Psychiatric Specialty Exam: Objective: Appearance: Malodorous   Eye Contact:: Fair   Speech: Slow   Volume: Decreased   Mood: "okay" Depression: 8-9/10 (0=Very depressed; 5=Neutral; 10=Very Happy)  Anxiety- 0/10 (0=no anxiety; 5= moderate/tolerable anxiety; 10= panic attacks)  Affect: Blunt   Thought Process: Logical   Orientation: Full   Thought Content: 0   Suicidal Thoughts: No   Homicidal Thoughts: No   Judgement: Impaired   Insight: Fair   Psychomotor Activity: Decreased   Akathisia: No   Handed: Right   Memory: Immediate 3/3 , recent- 2/3   AIMS (if indicated): See Chart   Assets: Financial Resources/Insurance  Housing  Social Support    Laboratory/X-Ray   No recent labs   Assessment:  AXIS I  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   AXIS III  Diabetes Mellitus, Type II; Hypertension   AXIS IV  problems related to social environment   AXIS V  51-60 moderate symptoms   Treatment Plan/Recommendations:     Plan of Care:  PLAN:  1. Affirm with the patient that the medications are taken as ordered. Patient  expressed understanding of how their medications were to be used.    Laboratory:  No labs warranted at this time.  Reviewed labs sent by PCP, HbA1c 6.2, Cholesterol- 176, fasting bllod sugar- 82  Psychotherapy: Therapy: brief supportive therapy provided.  Discussed psychosocial stressors in detail.  Will refer to individual therapy. Continue individual therapy.  Medications:  Continue the following psychiatric medications as written prior to this appointment with the following changes::  a) Continue bupropion XL 150 mg daily. b) Continue Risperidone 2 mg QHS.  c) Memantine has been changed and increase to 21 mg XR formulation-no change in dose at this time. Asked patient's sister to report to clinic if PCP is now controlling this prescription. d)  Lorazepam from  0.5 mg BID prescribed by PCP. May need to lower this based on mental status exam. -Risks and benefits, side effects and alternatives discussed with patient, he was given an opportunity to ask questions about his medication, illness, and treatment. All current psychiatric medications have been reviewed and discussed with the patient and adjusted as clinically appropriate. The patient has been provided an accurate and updated list of the medications being now prescribed.   Routine PRN Medications:  Negative  Consultations: The patient was encouraged to keep all PCP and specialty clinic appointments.   Safety Concerns:   Patient told to call clinic if any problems occur. Patient advised to go to  ER  if he should develop SI/HI, side effects, or if symptoms worsen. Has crisis numbers to call if needed.    Other:   8. Patient was  instructed to return to clinic in 2 months.  9. The patient was advised to call and cancel their mental health appointment within 24 hours of the appointment, if they are unable to keep the appointment, as well as the three no show and termination from clinic policy. 10. The patient expressed understanding of the plan and agrees with the above.     Jacqulyn Cane, M.D.  03/05/2013 1:45 PM

## 2013-03-06 DIAGNOSIS — F0390 Unspecified dementia without behavioral disturbance: Secondary | ICD-10-CM | POA: Insufficient documentation

## 2013-11-28 ENCOUNTER — Encounter (HOSPITAL_COMMUNITY): Payer: Self-pay | Admitting: Psychiatry

## 2013-11-28 ENCOUNTER — Ambulatory Visit (INDEPENDENT_AMBULATORY_CARE_PROVIDER_SITE_OTHER): Payer: Medicare Other | Admitting: Psychiatry

## 2013-11-28 VITALS — Wt 168.0 lb

## 2013-11-28 DIAGNOSIS — F03918 Unspecified dementia, unspecified severity, with other behavioral disturbance: Secondary | ICD-10-CM

## 2013-11-28 DIAGNOSIS — F329 Major depressive disorder, single episode, unspecified: Secondary | ICD-10-CM

## 2013-11-28 DIAGNOSIS — F919 Conduct disorder, unspecified: Secondary | ICD-10-CM

## 2013-11-28 DIAGNOSIS — F09 Unspecified mental disorder due to known physiological condition: Secondary | ICD-10-CM

## 2013-11-28 DIAGNOSIS — F0281 Dementia in other diseases classified elsewhere with behavioral disturbance: Secondary | ICD-10-CM

## 2013-11-28 DIAGNOSIS — F0391 Unspecified dementia with behavioral disturbance: Secondary | ICD-10-CM

## 2013-11-28 DIAGNOSIS — F02818 Dementia in other diseases classified elsewhere, unspecified severity, with other behavioral disturbance: Secondary | ICD-10-CM

## 2013-11-28 DIAGNOSIS — F3289 Other specified depressive episodes: Secondary | ICD-10-CM

## 2013-11-28 DIAGNOSIS — F32A Depression, unspecified: Secondary | ICD-10-CM

## 2013-11-28 MED ORDER — RISPERIDONE 2 MG PO TABS: ORAL_TABLET | ORAL | Status: DC

## 2013-11-28 MED ORDER — LORAZEPAM 1 MG PO TABS: 0.5000 mg | ORAL_TABLET | Freq: Two times a day (BID) | ORAL | Status: DC | PRN

## 2013-11-28 MED ORDER — BUPROPION HCL ER (XL) 150 MG PO TB24: 150.0000 mg | ORAL_TABLET | Freq: Every day | ORAL | Status: DC

## 2013-11-28 NOTE — Progress Notes (Signed)
Patient ID: Antonio Flynn, male   DOB: 06-May-1940, 73 y.o.   MRN: [id number] [facility] Follow-up Outpatient Visit  Antonio Flynn Jun 05, 1940  Date:  11/28/2013  HPI:   Mr. Hyland is a 73  Y/o male with a past psychiatric history significant for Dementia, NOS. The patient is referred for psychiatric services for medication management. He is here with his sister. He lives in assisted living facility.   Elements:  Location: The patient reports that he is doing well. Quite and pleasant. Speaks when asks.  Quality: The patient denies any current identifiable stressors.   The patient reports he is doing well. He states he is taking his medications and denies any side effects. His sister is here today to provide collateral information. She states the patient continues to do well and denies any concerns. She tries to get the patient to go out and get him walking when possible.  The patient denies any current stressors. He is here today with his sister. She reports that the patient is doing well .   In the area of affective symptoms, patient appears euthymic. Patient denies current suicidal ideation, intent, or plan. Patient denies current homicidal ideation, intent, or plan. Patient denies auditory hallucinations. Patient denies visual hallucinations. Patient denies symptoms of paranoia. Patient states sleep is good, with approximately 6-7 hours daily. Appetite is okay. Energy level is fair. Patient denies symptoms of anhedonia. Patient denies hopelessness, helplessness, or guilt.   Severity: Depression: 7/10 (0=Very depressed; 5=Neutral; 10=Very Happy) depression improved.  Anxiety- 0/10 (0=no anxiety; 5= moderate/tolerable anxiety; 10= panic attacks)  Timing: No symptoms currently. Symptoms have improved ov the past year. Duration: Over 3 years.  Context: None Associalted symtpoms:  Denies any recent episodes consistent with mania, particularly decreased need for sleep with increased  energy, grandiosity, impulsivity, hyperverbal and pressured speech, or increased productivity. Denies any recent symptoms consistent with psychosis, particularly auditory or visual hallucinations, thought broadcasting/insertion/withdrawal, or ideas of reference. Also denies excessive worry to the point of physical symptoms as well as any panic attacks. Denies any history of trauma or symptoms consistent with PTSD such as flashbacks, nightmares, hypervigilance, feelings of numbness or inability to connect with others.   Review of Systems  Constitutional: Negative for fever.  Respiratory: Negative for cough.   Cardiovascular: Negative for chest pain, palpitations and leg swelling.  Gastrointestinal: Negative for nausea, abdominal pain and diarrhea.  Neurological: Negative for weakness.  Psychiatric/Behavioral: Negative for depression, suicidal ideas, hallucinations and substance abuse.   Filed Vitals:   11/28/13 1455  Weight: 168 lb (76.204 kg)   Physical Exam  Vitals reviewed.  Constitutional: He appears well-developed and well-nourished. No distress.  Skin: He is not diaphoretic.  Musculoskeletal: Strength & Muscle Tone: within normal limits Gait & Station: normal Patient leans: N/A   Past Psychiatric History: Reviewed  Diagnosis: Depression, Dementia   Hospitalizations: One previous psychiatric hospitalization   Outpatient Care: assisted living   Substance Abuse Care: no   Self-Mutilation: no   Suicidal Attempts: none   Violent Behaviors: only "irritable" -verbal aggressiveness in the past prior to risperidone    Past Medical History: Reviewed  Past Medical History   Diagnosis  Date   .  Depression    .  Dementia      may have had a stroke    History of Loss of Consciousness: Yes  Seizure History: No  Cardiac History: Yes   Allergies: Reviewed  Allergies   Allergen  Reactions   .  Penicillins     Current Medications: Reviewed  Current Outpatient Prescriptions on  File Prior to Visit  Medication Sig Dispense Refill  . albuterol (PROVENTIL HFA;VENTOLIN HFA) 108 (90 BASE) MCG/ACT inhaler Inhale 2 puffs into the lungs every 6 (six) hours as needed.      Marland Kitchen amLODipine (NORVASC) 5 MG tablet Take 5 mg by mouth daily.      Marland Kitchen aspirin 325 MG tablet Take 325 mg by mouth daily.      . cetirizine (ZYRTEC) 10 MG tablet Take 10 mg by mouth daily.      . cholecalciferol (VITAMIN D) 1000 UNITS tablet Take 1,000 Units by mouth daily.      Marland Kitchen docusate sodium (COLACE) 100 MG capsule Take 100 mg by mouth every morning.      Marland Kitchen guaiFENesin (MUCINEX) 600 MG 12 hr tablet Take 600 mg by mouth 2 (two) times daily.      . hydrochlorothiazide (HYDRODIURIL) 25 MG tablet Take 25 mg by mouth daily.      . insulin aspart (NOVOLOG) 100 UNIT/ML injection Inject 2 Units into the skin 3 (three) times daily before meals.      . insulin detemir (LEVEMIR) 100 UNIT/ML injection Inject 8 Units into the skin at bedtime.       . lamoTRIgine (LAMICTAL) 100 MG tablet Take 50 mg by mouth 2 (two) times daily.      . Linaclotide 145 MCG CAPS Take 145 mg by mouth every morning.      Marland Kitchen losartan (COZAAR) 100 MG tablet Take 100 mg by mouth daily.      . Memantine HCl ER (NAMENDA XR) 21 MG CP24 Take 21 mg by mouth daily.      . metoprolol succinate (TOPROL-XL) 100 MG 24 hr tablet Take 100 mg by mouth daily. Take with or immediately following a meal.      . mirabegron ER (MYRBETRIQ) 25 MG TB24 tablet Take 50 mg by mouth daily.      . naproxen (NAPROSYN) 500 MG tablet Take 500 mg by mouth daily as needed.      Marland Kitchen omeprazole (PRILOSEC) 20 MG capsule Take 20 mg by mouth daily.      . potassium chloride SA (K-DUR,KLOR-CON) 20 MEQ tablet Take 20 mEq by mouth every morning.      . Tamsulosin HCl (FLOMAX) 0.4 MG CAPS Take 0.4 mg by mouth 2 (two) times daily.       No current facility-administered medications on file prior to visit.      Family History: History reviewed. No pertinent family history.    Psychiatric Specialty Exam: Objective: Appearance: Malodorous   Eye Contact:: Fair   Speech: Slow   Volume: Decreased   Mood: "okay" Depression: 8-9/10 (0=Very depressed; 5=Neutral; 10=Very Happy)  Anxiety- 0/10 (0=no anxiety; 5= moderate/tolerable anxiety; 10= panic attacks)  Affect: Blunt   Thought Process: Logical   Orientation: Full   Thought Content: 0   Suicidal Thoughts: No   Homicidal Thoughts: No   Judgement: Impaired   Insight: Fair   Psychomotor Activity: Decreased   Akathisia: No   Handed: Right   Memory: Immediate 3/3 , recent- 2/3   AIMS (if indicated): See Chart   Assets: Financial Resources/Insurance  Housing  Social Support    Laboratory/X-Ray   No recent labs   Assessment:  AXIS I  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   AXIS III  Diabetes Mellitus,  Type II; Hypertension   AXIS IV  problems related to social environment   AXIS V  51-60 moderate symptoms   Treatment Plan/Recommendations:     Plan of Care:  PLAN:  1. Affirm with the patient that the medications are taken as ordered. Patient  expressed understanding of how their medications were to be used.    Laboratory:  No labs warranted at this time.  Reviewed labs sent by PCP, HbA1c 6.2, Cholesterol- 176, fasting bllod sugar- 82  Psychotherapy: Therapy: brief supportive therapy provided.  Discussed psychosocial stressors in detail.  Will refer to individual therapy. Continue individual therapy.  Medications:  Continue the following psychiatric medications as written prior to this appointment with the following changes::  Continue Risperdal 2 mg. Wellbutrin. Sister wants and to continue with the Ativan  helps his agitation and anxiety. 0.5 mg bid.  -Risks and benefits, side effects and alternatives discussed with patient, he was given an opportunity to ask questions about his medication, illness, and  treatment. All current psychiatric medications have been reviewed and discussed with the patient and adjusted as clinically appropriate. The patient has been provided an accurate and updated list of the medications being now prescribed.   Routine PRN Medications:  Negative  Consultations: The patient was encouraged to keep all PCP and specialty clinic appointments.   Safety Concerns:   Patient told to call clinic if any problems occur. Patient advised to go to  ER  if he should develop SI/HI, side effects, or if symptoms worsen. Has crisis numbers to call if needed.    Other:   8. Patient was instructed to return to clinic in 2 months. Sister wants him to come 4 months. Will call earlier if needed. Follow up with other providers on a regular basis.       Merian Capron, M.D.  11/28/2013 2:57 PM

## 2014-03-17 ENCOUNTER — Ambulatory Visit (HOSPITAL_COMMUNITY): Payer: Self-pay | Admitting: Psychiatry

## 2015-02-02 ENCOUNTER — Other Ambulatory Visit: Payer: Self-pay | Admitting: Family Medicine

## 2015-02-02 ENCOUNTER — Ambulatory Visit
Admission: RE | Admit: 2015-02-02 | Discharge: 2015-02-02 | Disposition: A | Discharge status: Home / Self Care | Payer: Self-pay | Source: Ambulatory Visit | Attending: Family Medicine | Admitting: Family Medicine

## 2015-02-02 DIAGNOSIS — R0602 Shortness of breath: Secondary | ICD-10-CM

## 2015-10-21 ENCOUNTER — Encounter (HOSPITAL_COMMUNITY): Payer: Self-pay | Admitting: Psychiatry

## 2015-10-21 ENCOUNTER — Ambulatory Visit (INDEPENDENT_AMBULATORY_CARE_PROVIDER_SITE_OTHER): Payer: Medicare Other | Admitting: Psychiatry

## 2015-10-21 VITALS — BP 128/70 | HR 62 | Ht 67.0 in | Wt 164.0 lb

## 2015-10-21 DIAGNOSIS — F329 Major depressive disorder, single episode, unspecified: Secondary | ICD-10-CM

## 2015-10-21 DIAGNOSIS — F02818 Dementia in other diseases classified elsewhere, unspecified severity, with other behavioral disturbance: Secondary | ICD-10-CM

## 2015-10-21 DIAGNOSIS — F32A Depression, unspecified: Secondary | ICD-10-CM

## 2015-10-21 DIAGNOSIS — F0391 Unspecified dementia with behavioral disturbance: Secondary | ICD-10-CM | POA: Diagnosis not present

## 2015-10-21 DIAGNOSIS — F0281 Dementia in other diseases classified elsewhere with behavioral disturbance: Secondary | ICD-10-CM | POA: Diagnosis not present

## 2015-10-21 DIAGNOSIS — F03918 Unspecified dementia, unspecified severity, with other behavioral disturbance: Secondary | ICD-10-CM

## 2015-10-21 MED ORDER — RISPERIDONE 2 MG PO TABS: ORAL_TABLET | ORAL | Status: DC

## 2015-10-21 MED ORDER — LORAZEPAM 1 MG PO TABS: 0.5000 mg | ORAL_TABLET | Freq: Two times a day (BID) | ORAL | Status: DC | PRN

## 2015-10-21 MED ORDER — LAMOTRIGINE 100 MG PO TABS: 50.0000 mg | ORAL_TABLET | Freq: Two times a day (BID) | ORAL | Status: DC

## 2015-10-21 NOTE — Progress Notes (Signed)
Patient ID: Antonio Flynn, male   DOB: Apr 25, 1940, 75 y.o.   MRN: [id number] Patient ID: Antonio Flynn, male   DOB: [birth year], 75 y.o.   MRN: [id number] [facility] Follow-up Outpatient Visit  Bravin Helman December 14, 1940  Date: 10/21/2015  HPI:   [name] is a 75  Y/o male with a past psychiatric history significant for Dementia, NOS. The patient returns after more then an year for follow up  for psychiatric services for medication management. He is here with his another sister. He lives in assisted living facility.   Elements:  Initial presentation was for behavioural disturbance , some down mood and forgetullness. Location: The patient reports that he is doing well. Quite and pleasant. Speaks when asks.  Sister states no behvioural issues except memory loss and forgetfullness.  He repeats questions or nods at times   Some confusion from the list of meds sister gave me does not include wellbutrin.  Sister states one med was stopped by primary care as he was having some chewing movements but i still see risperdal on list.  Sister not aware which one and will communicate with other sister. Some chewing movement noticeable but patient states does not bother him.  Severity: Depression: 7/10 (0=Very depressed; 5=Neutral; 10=Very Happy) depression improved.  Anxiety- 1/10 (0=no anxiety; 5= moderate/tolerable anxiety; 10= panic attacks)   Duration: Over 5  years.  Context: Age. Medical diagnosis.   Review of Systems  Constitutional: Negative for fever.  Respiratory: Negative for cough.   Cardiovascular: Negative for chest pain and leg swelling.  Gastrointestinal: Negative for nausea, abdominal pain and diarrhea.  Skin: Negative for rash.  Neurological: Negative for weakness.  Psychiatric/Behavioral: Negative for suicidal ideas, hallucinations and substance abuse.   Filed Vitals:   10/21/15 1045  BP: 128/70  Pulse: 62  Height: 5\' 7"  (1.702 m)  Weight: 164 lb (74.39 kg)   SpO2: 94%   Physical Exam  Vitals reviewed.  Constitutional: He appears well-developed and well-nourished. No distress.  Skin: He is not diaphoretic.  Musculoskeletal: Strength & Muscle Tone: within normal limits Gait & Station: normal Patient leans: N/A   Past Psychiatric History: Reviewed  Diagnosis: Depression, Dementia   Hospitalizations: One previous psychiatric hospitalization   Outpatient Care: assisted living   Substance Abuse Care: no   Self-Mutilation: no   Suicidal Attempts: none   Violent Behaviors: only "irritable" -verbal aggressiveness in the past prior to risperidone    Past Medical History: Reviewed  Past Medical History   Diagnosis  Date   .  Depression    .  Dementia      may have had a stroke    History of Loss of Consciousness: Yes  Seizure History: No  Cardiac History: Yes   Allergies: Reviewed  Allergies   Allergen  Reactions   .  Penicillins     Current Medications: Reviewed  Current Outpatient Prescriptions on File Prior to Visit  Medication Sig Dispense Refill  . albuterol (PROVENTIL HFA;VENTOLIN HFA) 108 (90 BASE) MCG/ACT inhaler Inhale 2 puffs into the lungs every 6 (six) hours as needed.    Marland Kitchen amLODipine (NORVASC) 5 MG tablet Take 5 mg by mouth daily.    Marland Kitchen aspirin 325 MG tablet Take 325 mg by mouth daily.    . cetirizine (ZYRTEC) 10 MG tablet Take 10 mg by mouth daily.    . cholecalciferol (VITAMIN D) 1000 UNITS tablet Take 1,000 Units by mouth daily.    Marland Kitchen docusate sodium (COLACE) 100 MG capsule  Take 100 mg by mouth every morning.    Marland Kitchen guaiFENesin (MUCINEX) 600 MG 12 hr tablet Take 600 mg by mouth 2 (two) times daily.    . hydrochlorothiazide (HYDRODIURIL) 25 MG tablet Take 25 mg by mouth daily.    . insulin aspart (NOVOLOG) 100 UNIT/ML injection Inject 2 Units into the skin 3 (three) times daily before meals.    . insulin detemir (LEVEMIR) 100 UNIT/ML injection Inject 8 Units into the skin at bedtime.     . Linaclotide 145 MCG CAPS  Take 145 mg by mouth every morning.    Marland Kitchen losartan (COZAAR) 100 MG tablet Take 100 mg by mouth daily.    . Memantine HCl ER (NAMENDA XR) 21 MG CP24 Take 21 mg by mouth daily.    . metoprolol succinate (TOPROL-XL) 100 MG 24 hr tablet Take 100 mg by mouth daily. Take with or immediately following a meal.    . mirabegron ER (MYRBETRIQ) 25 MG TB24 tablet Take 50 mg by mouth daily.    . naproxen (NAPROSYN) 500 MG tablet Take 500 mg by mouth daily as needed.    Marland Kitchen omeprazole (PRILOSEC) 20 MG capsule Take 20 mg by mouth daily.    . potassium chloride SA (K-DUR,KLOR-CON) 20 MEQ tablet Take 20 mEq by mouth every morning.    . Tamsulosin HCl (FLOMAX) 0.4 MG CAPS Take 0.4 mg by mouth 2 (two) times daily.    Marland Kitchen buPROPion (WELLBUTRIN XL) 150 MG 24 hr tablet Take 1 tablet (150 mg total) by mouth daily after breakfast. (Patient not taking: Reported on 10/21/2015) 30 tablet 3   No current facility-administered medications on file prior to visit.      Family History: History reviewed. No pertinent family history.   Psychiatric Specialty Exam: Objective: Appearance: casual  Eye Contact:: Fair   Speech: Slow   Volume: Decreased   Mood: "okay" Depression: 8-9/10 (0=Very depressed; 5=Neutral; 10=Very Happy)  Anxiety- 0/10 (0=no anxiety; 5= moderate/tolerable anxiety; 10= panic attacks)  Affect: Blunt   Thought Process: Logical   Orientation: Full   Thought Content: 0   Suicidal Thoughts: No   Homicidal Thoughts: No   Judgement: Impaired   Insight: Fair to poor  Psychomotor Activity: Decreased   Akathisia: No   Handed: Right   Memory: Immediate 1/3 , recent- 1/3   AIMS (if indicated): See Chart   Assets: Financial Resources/Insurance  Housing  Social Support    Laboratory/X-Ray   No recent labs   Assessment:  AXIS I  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   AXIS III  Diabetes Mellitus, Type II; Hypertension    AXIS IV  problems related to social environment   AXIS V  51-60 moderate symptoms   Treatment Plan/Recommendations:     Plan of Care:  PLAN:  1. Affirm with the patient that the medications are taken as ordered. Patient  expressed understanding of how their medications were to be used.    Laboratory: as per primary care  Psychotherapy: Therapy: brief supportive therapy provided.  Discussed psychosocial stressors in detail.  Will refer to individual therapy. Continue individual therapy.  Medications:  Continue the following psychiatric medications as written prior to this appointment with the following changes::  Continue Risperdal 2 mg (half tablet). Lamictal.  Ativan  helps his agitation and anxiety. 0.5 mg bid.  Printout given except for wellbutrin till we figure out if he is taking it Sister to  let us know what are his active meds and what med was stopped earlier  -Risks and benefits, side effects and alternatives discussed with patient, he was given an opportunity to ask questions about his medication, illness, and treatment. All current psychiatric medications have been reviewed and discussed with the patient and adjusted as clinically appropriate. The patient has been provided an accurate and updated list of the medications being now prescribed.   Routine PRN Medications:  Negative  Consultations: The patient was encouraged to keep all PCP and specialty clinic appointments.   Safety Concerns:   Patient told to call clinic if any problems occur. Patient advised to go to  ER  if he should develop SI/HI, side effects, or if symptoms worsen. Has crisis numbers to call if needed.    Other:   . Patient was instructed to return to clinic in 1 month . Sister wants him to come 4 months. Will call earlier if needed. Follow up with other providers on a regular basis.  Call for refills if further needed.  Time spent: 25 minutes   Merian Capron, M.D.  10/21/2015 11:14 AM

## 2015-10-21 NOTE — Patient Instructions (Signed)
Sister not sure exact what meds he is taking.  wellbutrin not on list anymore so I am not refilling Patient sisters need to sit together and call me back regarding his active psych meds For now i am refilling  lamictal Ativan risperdal They will need to let me know of wellbutrin if he is taking or not and i can refill

## 2015-12-30 ENCOUNTER — Encounter (HOSPITAL_COMMUNITY): Payer: Self-pay | Admitting: Psychiatry

## 2015-12-30 ENCOUNTER — Ambulatory Visit (INDEPENDENT_AMBULATORY_CARE_PROVIDER_SITE_OTHER): Payer: Medicare Other | Admitting: Psychiatry

## 2015-12-30 VITALS — BP 129/85 | HR 75 | Resp 12 | Ht 68.0 in | Wt 148.4 lb

## 2015-12-30 DIAGNOSIS — F0391 Unspecified dementia with behavioral disturbance: Secondary | ICD-10-CM | POA: Diagnosis not present

## 2015-12-30 NOTE — Progress Notes (Signed)
Psychiatric Initial Adult Assessment   Patient Identification: Antonio Flynn MRN:  [id number] Date of Evaluation:  12/30/2015 Referral Source: Transfer to our center Chief Complaint:   Visit Diagnosis: Dementia with depression  History of Present Illness:  This patient is a 75 year old African-American divorced male is been diagnosed with a dementia disorder. He lives at [facility] assisted [facility]. He is seen with his sister, Antonio Flynn. The patient actually is doing very well. He likes where he lives. According to his sister there is no agitation no violence. In general the patient is calm and compliant and cooperative. He himself denies any depression. His appetite is fairly poor and he has had some weight loss. He says food does not taste well. He is sleeping fairly well he is active. He is not suicidal. He's never made a suicide attempt. He doesn't drink alcohol now and never really has. He is no history of illicit drug use. Today he denies any auditory or visual hallucinations. He denies being paranoid. In the past he apparently was on Risperdal 2 mg but was stopped. According to his sister he had mouth movements. The patient denies any pain. He denies any symptoms consistent with mania. He denies symptoms consistent with panic disorder or obsessive-compulsive disorder. He denies any anxiety at all. He has a past psychiatric hospitalization one time at [facility] 5 years ago. He was in the hospital for 1 week for unclear reasons. In the past the patient has been on Wellbutrin but no longer. At this time is very stable. The patient does have 2 children but cannot remember the names. I believe he is significantly memory impaired. He is well-dressed logical and reasonable but slow mentating. He talks very little.  Associated Signs/Symptoms: Depression Symptoms:  psychomotor retardation, (Hypo) Manic Symptoms:   Anxiety Symptoms:   Psychotic Symptoms:   PTSD Symptoms:   Past Psychiatric History: One  psychiatric hospitalization and continue outpatient psychiatric care from the [facility] behavioral health system in [place]   Previous Psychotropic Medications: Yes   Substance Abuse History in the last 12 months:  No.  Consequences of Substance Abuse:   Past Medical History:  Past Medical History:  Diagnosis Date  . Constipation   . Dementia    may have had a stroke  . Depression   . Diabetes mellitus type I ([place])   . Hypertension     Past Surgical History:  Procedure Laterality Date  . CATARACT EXTRACTION W/ INTRAOCULAR LENS IMPLANT      Family Psychiatric History:   Family History:  Family History  Problem Relation Age of Onset  . Hypertension Sister   . Hypertension Brother   . Hypertension Sister     Social History:   Social History   Social History  . Marital status: Single    Spouse name: N/A  . Number of children: N/A  . Years of education: N/A   Social History Main Topics  . Smoking status: Current Every Day Smoker    Packs/day: 0.33    Years: 50.00    Types: Cigarettes  . Smokeless tobacco: None     Comment: patches  . Alcohol use No  . Drug use: No     Comment: before entering [employer]  . Sexual activity: No   Other Topics Concern  . None   Social History Narrative  . None    Additional Social History:   Allergies:   Allergies  Allergen Reactions  . Penicillins     Metabolic Disorder Labs: No  results found for: HGBA1C, MPG No results found for: PROLACTIN No results found for: CHOL, TRIG, HDL, CHOLHDL, VLDL, LDLCALC   Current Medications: Current Outpatient Prescriptions  Medication Sig Dispense Refill  . albuterol (PROVENTIL HFA;VENTOLIN HFA) 108 (90 BASE) MCG/ACT inhaler Inhale 2 puffs into the lungs every 6 (six) hours as needed.    Marland Kitchen amLODipine (NORVASC) 5 MG tablet Take 5 mg by mouth daily.    Marland Kitchen aspirin 325 MG tablet Take 325 mg by mouth daily.    Marland Kitchen azelastine (ASTELIN) 0.1 % nasal spray Place into the nose.    Marland Kitchen  buPROPion (WELLBUTRIN XL) 150 MG 24 hr tablet Take 1 tablet (150 mg total) by mouth daily after breakfast. (Patient not taking: Reported on 10/21/2015) 30 tablet 3  . cetirizine (ZYRTEC) 10 MG tablet Take 10 mg by mouth daily.    . cholecalciferol (VITAMIN D) 1000 UNITS tablet Take 1,000 Units by mouth daily.    Marland Kitchen docusate sodium (COLACE) 100 MG capsule Take 100 mg by mouth every morning.    Marland Kitchen guaiFENesin (MUCINEX) 600 MG 12 hr tablet Take 600 mg by mouth 2 (two) times daily.    . hydrochlorothiazide (HYDRODIURIL) 25 MG tablet Take 25 mg by mouth daily.    . insulin aspart (NOVOLOG) 100 UNIT/ML injection Inject 2 Units into the skin 3 (three) times daily before meals.    . insulin detemir (LEVEMIR) 100 UNIT/ML injection Inject 8 Units into the skin at bedtime.     . lamoTRIgine (LAMICTAL) 100 MG tablet Take 0.5 tablets (50 mg total) by mouth 2 (two) times daily. 60 tablet 0  . Linaclotide 145 MCG CAPS Take 145 mg by mouth every morning.    Marland Kitchen LORazepam (ATIVAN) 1 MG tablet Take 0.5 tablets (0.5 mg total) by mouth 2 (two) times daily as needed. 60 tablet 1  . losartan (COZAAR) 100 MG tablet Take 100 mg by mouth daily.    . Memantine HCl ER (NAMENDA XR) 21 MG CP24 Take 21 mg by mouth daily.    . metoprolol succinate (TOPROL-XL) 100 MG 24 hr tablet Take 100 mg by mouth daily. Take with or immediately following a meal.    . mirabegron ER (MYRBETRIQ) 25 MG TB24 tablet Take 50 mg by mouth daily.    . naproxen (NAPROSYN) 500 MG tablet Take 500 mg by mouth daily as needed.    Marland Kitchen omeprazole (PRILOSEC) 20 MG capsule Take 20 mg by mouth daily.    . potassium chloride SA (K-DUR,KLOR-CON) 20 MEQ tablet Take 20 mEq by mouth every morning.    . risperiDONE (RISPERDAL) 2 MG tablet Take one tablet By mouth  before bedtime 30 tablet 0  . Tamsulosin HCl (FLOMAX) 0.4 MG CAPS Take 0.4 mg by mouth 2 (two) times daily.     No current facility-administered medications for this visit.     Neurologic: Headache:  No Seizure: No Paresthesias:No  Musculoskeletal: Strength & Muscle Tone: within normal limits Gait & Station: normal Patient leans: N/A  Psychiatric Specialty Exam: ROS  Blood pressure 129/85, pulse 75, resp. rate 12, height 5\' 8"  (1.727 m), weight 148 lb 6.4 oz (67.3 kg).Body mass index is 22.56 kg/m.  General Appearance: Casual  Eye Contact:  Good  Speech:  Clear and Coherent  Volume:  Normal  Mood:  NA  Affect:  Blunt  Thought Process:  Coherent  Orientation:  NA  Thought Content:  WDL    Homicidal Thoughts:  No  Memory:    Judgement:  Insight:  Fair  Psychomotor Activity:  Normal  Concentration:    Recall:  Poor  Fund of Knowledge:Poor  Language: Fair  Akathisia:  No  Handed:  Right  AIMS (if indicated):    Assets:  Communication Skills  ADL's:  Impaired  Cognition: WNL  Sleep:      Treatment Plan Summary: At this time this patient will continue taking Lamictal at a very small dose. Will not change any of his medicines. The most important thing we can do for this patient is be consistent. He's had multiple psychiatric providers any change TIME. WE WILL BE CONSISTENT WITH LAMICTAL AND CONTINUE TAKING ATIVAN 1 MG TWICE A DAY. The patient feels well. There is no reason for him to be on an antipsychotic medicine. Is not vomiting for aggressive and he shows no evidence of psychosis. This patient will have a repeated evaluation in 4 months. At that time the consideration of discontinuing his Lamictal will be considered. At this time this had no falls and he is medically very stable. He denies shortness of breath or chest pain. He denies any neurological symptoms.    Haskel Schroeder, MD 9/27/20174:04 PM

## 2016-05-04 ENCOUNTER — Ambulatory Visit (HOSPITAL_COMMUNITY): Payer: Self-pay | Admitting: Psychiatry

## 2017-04-02 ENCOUNTER — Emergency Department (HOSPITAL_COMMUNITY): Payer: Medicare Other

## 2017-04-02 ENCOUNTER — Other Ambulatory Visit: Payer: Self-pay

## 2017-04-02 ENCOUNTER — Inpatient Hospital Stay (HOSPITAL_COMMUNITY)
Admission: EM | Admit: 2017-04-02 | Discharge: 2017-04-13 | DRG: 023 | Disposition: A | Discharge status: Hospice | Payer: Medicare Other | Attending: Neurology | Admitting: Neurology

## 2017-04-02 ENCOUNTER — Inpatient Hospital Stay (HOSPITAL_COMMUNITY): Payer: Medicare Other

## 2017-04-02 ENCOUNTER — Encounter (HOSPITAL_COMMUNITY): Payer: Self-pay

## 2017-04-02 DIAGNOSIS — I613 Nontraumatic intracerebral hemorrhage in brain stem: Secondary | ICD-10-CM | POA: Diagnosis not present

## 2017-04-02 DIAGNOSIS — D649 Anemia, unspecified: Secondary | ICD-10-CM | POA: Diagnosis not present

## 2017-04-02 DIAGNOSIS — R1312 Dysphagia, oropharyngeal phase: Secondary | ICD-10-CM | POA: Diagnosis not present

## 2017-04-02 DIAGNOSIS — Z6822 Body mass index (BMI) 22.0-22.9, adult: Secondary | ICD-10-CM

## 2017-04-02 DIAGNOSIS — F0390 Unspecified dementia without behavioral disturbance: Secondary | ICD-10-CM | POA: Diagnosis present

## 2017-04-02 DIAGNOSIS — W06XXXA Fall from bed, initial encounter: Secondary | ICD-10-CM | POA: Diagnosis not present

## 2017-04-02 DIAGNOSIS — I615 Nontraumatic intracerebral hemorrhage, intraventricular: Principal | ICD-10-CM | POA: Diagnosis present

## 2017-04-02 DIAGNOSIS — E876 Hypokalemia: Secondary | ICD-10-CM

## 2017-04-02 DIAGNOSIS — R1310 Dysphagia, unspecified: Secondary | ICD-10-CM | POA: Diagnosis present

## 2017-04-02 DIAGNOSIS — G9340 Encephalopathy, unspecified: Secondary | ICD-10-CM | POA: Diagnosis not present

## 2017-04-02 DIAGNOSIS — G911 Obstructive hydrocephalus: Secondary | ICD-10-CM | POA: Diagnosis present

## 2017-04-02 DIAGNOSIS — H510 Palsy (spasm) of conjugate gaze: Secondary | ICD-10-CM | POA: Diagnosis present

## 2017-04-02 DIAGNOSIS — N400 Enlarged prostate without lower urinary tract symptoms: Secondary | ICD-10-CM | POA: Diagnosis present

## 2017-04-02 DIAGNOSIS — E860 Dehydration: Secondary | ICD-10-CM | POA: Diagnosis present

## 2017-04-02 DIAGNOSIS — R29715 NIHSS score 15: Secondary | ICD-10-CM | POA: Diagnosis present

## 2017-04-02 DIAGNOSIS — J9600 Acute respiratory failure, unspecified whether with hypoxia or hypercapnia: Secondary | ICD-10-CM | POA: Diagnosis present

## 2017-04-02 DIAGNOSIS — R0602 Shortness of breath: Secondary | ICD-10-CM

## 2017-04-02 DIAGNOSIS — R509 Fever, unspecified: Secondary | ICD-10-CM | POA: Diagnosis not present

## 2017-04-02 DIAGNOSIS — E1051 Type 1 diabetes mellitus with diabetic peripheral angiopathy without gangrene: Secondary | ICD-10-CM | POA: Diagnosis present

## 2017-04-02 DIAGNOSIS — I616 Nontraumatic intracerebral hemorrhage, multiple localized: Secondary | ICD-10-CM

## 2017-04-02 DIAGNOSIS — F0391 Unspecified dementia with behavioral disturbance: Secondary | ICD-10-CM | POA: Diagnosis present

## 2017-04-02 DIAGNOSIS — C61 Malignant neoplasm of prostate: Secondary | ICD-10-CM | POA: Diagnosis present

## 2017-04-02 DIAGNOSIS — E43 Unspecified severe protein-calorie malnutrition: Secondary | ICD-10-CM | POA: Diagnosis present

## 2017-04-02 DIAGNOSIS — J9601 Acute respiratory failure with hypoxia: Secondary | ICD-10-CM | POA: Diagnosis not present

## 2017-04-02 DIAGNOSIS — G8194 Hemiplegia, unspecified affecting left nondominant side: Secondary | ICD-10-CM | POA: Diagnosis present

## 2017-04-02 DIAGNOSIS — I161 Hypertensive emergency: Secondary | ICD-10-CM | POA: Diagnosis present

## 2017-04-02 DIAGNOSIS — I618 Other nontraumatic intracerebral hemorrhage: Secondary | ICD-10-CM | POA: Diagnosis present

## 2017-04-02 DIAGNOSIS — I10 Essential (primary) hypertension: Secondary | ICD-10-CM | POA: Diagnosis present

## 2017-04-02 DIAGNOSIS — N179 Acute kidney failure, unspecified: Secondary | ICD-10-CM | POA: Diagnosis not present

## 2017-04-02 DIAGNOSIS — E785 Hyperlipidemia, unspecified: Secondary | ICD-10-CM | POA: Diagnosis present

## 2017-04-02 DIAGNOSIS — Z794 Long term (current) use of insulin: Secondary | ICD-10-CM | POA: Diagnosis not present

## 2017-04-02 DIAGNOSIS — I6200 Nontraumatic subdural hemorrhage, unspecified: Secondary | ICD-10-CM | POA: Diagnosis not present

## 2017-04-02 DIAGNOSIS — Z79899 Other long term (current) drug therapy: Secondary | ICD-10-CM

## 2017-04-02 DIAGNOSIS — Z66 Do not resuscitate: Secondary | ICD-10-CM | POA: Diagnosis not present

## 2017-04-02 DIAGNOSIS — R000 Tachycardia, unspecified: Secondary | ICD-10-CM | POA: Diagnosis present

## 2017-04-02 DIAGNOSIS — E119 Type 2 diabetes mellitus without complications: Secondary | ICD-10-CM

## 2017-04-02 DIAGNOSIS — J9602 Acute respiratory failure with hypercapnia: Secondary | ICD-10-CM | POA: Diagnosis not present

## 2017-04-02 DIAGNOSIS — I361 Nonrheumatic tricuspid (valve) insufficiency: Secondary | ICD-10-CM | POA: Diagnosis not present

## 2017-04-02 DIAGNOSIS — E1159 Type 2 diabetes mellitus with other circulatory complications: Secondary | ICD-10-CM

## 2017-04-02 DIAGNOSIS — F17200 Nicotine dependence, unspecified, uncomplicated: Secondary | ICD-10-CM | POA: Diagnosis present

## 2017-04-02 DIAGNOSIS — F17210 Nicotine dependence, cigarettes, uncomplicated: Secondary | ICD-10-CM | POA: Diagnosis present

## 2017-04-02 DIAGNOSIS — R29810 Facial weakness: Secondary | ICD-10-CM | POA: Diagnosis present

## 2017-04-02 DIAGNOSIS — F0151 Vascular dementia with behavioral disturbance: Secondary | ICD-10-CM | POA: Diagnosis not present

## 2017-04-02 DIAGNOSIS — Z7982 Long term (current) use of aspirin: Secondary | ICD-10-CM

## 2017-04-02 DIAGNOSIS — E870 Hyperosmolality and hypernatremia: Secondary | ICD-10-CM | POA: Diagnosis not present

## 2017-04-02 DIAGNOSIS — Z880 Allergy status to penicillin: Secondary | ICD-10-CM

## 2017-04-02 DIAGNOSIS — Z515 Encounter for palliative care: Secondary | ICD-10-CM | POA: Diagnosis not present

## 2017-04-02 DIAGNOSIS — R251 Tremor, unspecified: Secondary | ICD-10-CM | POA: Diagnosis present

## 2017-04-02 DIAGNOSIS — Z4659 Encounter for fitting and adjustment of other gastrointestinal appliance and device: Secondary | ICD-10-CM

## 2017-04-02 HISTORY — DX: Vitamin D deficiency, unspecified: E55.9

## 2017-04-02 HISTORY — DX: Anxiety disorder, unspecified: F41.9

## 2017-04-02 HISTORY — DX: Hyperlipidemia, unspecified: E78.5

## 2017-04-02 HISTORY — DX: Benign prostatic hyperplasia without lower urinary tract symptoms: N40.0

## 2017-04-02 HISTORY — DX: Hypocalcemia: E83.51

## 2017-04-02 LAB — GLUCOSE, CAPILLARY
GLUCOSE-CAPILLARY: 118 mg/dL — AB (ref 65–99)
Glucose-Capillary: 112 mg/dL — ABNORMAL HIGH (ref 65–99)

## 2017-04-02 LAB — DIFFERENTIAL
BASOS ABS: 0 10*3/uL (ref 0.0–0.1)
BASOS PCT: 1 %
Eosinophils Absolute: 0.4 10*3/uL (ref 0.0–0.7)
Eosinophils Relative: 7 %
LYMPHS ABS: 1.6 10*3/uL (ref 0.7–4.0)
LYMPHS PCT: 25 %
MONOS PCT: 6 %
Monocytes Absolute: 0.4 10*3/uL (ref 0.1–1.0)
NEUTROS ABS: 4 10*3/uL (ref 1.7–7.7)
Neutrophils Relative %: 61 %

## 2017-04-02 LAB — RAPID URINE DRUG SCREEN, HOSP PERFORMED
Amphetamines: NOT DETECTED
BARBITURATES: NOT DETECTED
Benzodiazepines: POSITIVE — AB
Cocaine: NOT DETECTED
OPIATES: NOT DETECTED
TETRAHYDROCANNABINOL: NOT DETECTED

## 2017-04-02 LAB — PROTIME-INR
INR: 1.08
Prothrombin Time: 13.9 seconds (ref 11.4–15.2)

## 2017-04-02 LAB — CBC
HEMATOCRIT: 39.9 % (ref 39.0–52.0)
HEMOGLOBIN: 14.1 g/dL (ref 13.0–17.0)
MCH: 30.4 pg (ref 26.0–34.0)
MCHC: 35.3 g/dL (ref 30.0–36.0)
MCV: 86 fL (ref 78.0–100.0)
Platelets: 140 10*3/uL — ABNORMAL LOW (ref 150–400)
RBC: 4.64 MIL/uL (ref 4.22–5.81)
RDW: 14.9 % (ref 11.5–15.5)
WBC: 6.4 10*3/uL (ref 4.0–10.5)

## 2017-04-02 LAB — I-STAT CHEM 8, ED
BUN: 8 mg/dL (ref 6–20)
CALCIUM ION: 1.17 mmol/L (ref 1.15–1.40)
CHLORIDE: 106 mmol/L (ref 101–111)
CREATININE: 1.2 mg/dL (ref 0.61–1.24)
GLUCOSE: 94 mg/dL (ref 65–99)
HCT: 42 % (ref 39.0–52.0)
Hemoglobin: 14.3 g/dL (ref 13.0–17.0)
POTASSIUM: 4.2 mmol/L (ref 3.5–5.1)
Sodium: 143 mmol/L (ref 135–145)
TCO2: 25 mmol/L (ref 22–32)

## 2017-04-02 LAB — COMPREHENSIVE METABOLIC PANEL
ALK PHOS: 61 U/L (ref 38–126)
ALT: 13 U/L — AB (ref 17–63)
AST: 16 U/L (ref 15–41)
Albumin: 3.5 g/dL (ref 3.5–5.0)
Anion gap: 5 (ref 5–15)
BUN: 7 mg/dL (ref 6–20)
CALCIUM: 9.2 mg/dL (ref 8.9–10.3)
CHLORIDE: 110 mmol/L (ref 101–111)
CO2: 25 mmol/L (ref 22–32)
CREATININE: 1.22 mg/dL (ref 0.61–1.24)
GFR calc Af Amer: >60 mL/min (ref >60)
GFR, EST NON AFRICAN AMERICAN: 56 mL/min — AB (ref >60)
Glucose, Bld: 94 mg/dL (ref 65–99)
Potassium: 4.1 mmol/L (ref 3.5–5.1)
Sodium: 140 mmol/L (ref 135–145)
Total Bilirubin: 1.2 mg/dL (ref 0.3–1.2)
Total Protein: 6.4 g/dL — ABNORMAL LOW (ref 6.5–8.1)

## 2017-04-02 LAB — URINALYSIS, ROUTINE W REFLEX MICROSCOPIC
Bilirubin Urine: NEGATIVE
Glucose, UA: NEGATIVE mg/dL
Hgb urine dipstick: NEGATIVE
KETONES UR: NEGATIVE mg/dL
LEUKOCYTES UA: NEGATIVE
NITRITE: NEGATIVE
PH: 7 (ref 5.0–8.0)
PROTEIN: NEGATIVE mg/dL
Specific Gravity, Urine: 1.008 (ref 1.005–1.030)

## 2017-04-02 LAB — MRSA PCR SCREENING: MRSA BY PCR: NEGATIVE

## 2017-04-02 LAB — APTT: APTT: 31 s (ref 24–36)

## 2017-04-02 LAB — CBG MONITORING, ED: Glucose-Capillary: 86 mg/dL (ref 65–99)

## 2017-04-02 LAB — TSH: TSH: 0.591 u[IU]/mL (ref 0.350–4.500)

## 2017-04-02 LAB — ETHANOL: Alcohol, Ethyl (B): <10 mg/dL (ref <10)

## 2017-04-02 MED ORDER — ONDANSETRON HCL 4 MG/2ML IJ SOLN: 4.0000 mg | Freq: Four times a day (QID) | INTRAMUSCULAR | Status: DC | PRN

## 2017-04-02 MED ORDER — ACETAMINOPHEN 160 MG/5ML PO SOLN
650.0000 mg | ORAL | Status: DC | PRN
  Administered 2017-04-06 – 2017-04-11 (×5): 650 mg
  Filled 2017-04-02 (×5): qty 20.3

## 2017-04-02 MED ORDER — CLEVIDIPINE BUTYRATE 0.5 MG/ML IV EMUL
0.0000 mg/h | INTRAVENOUS | Status: DC
Start: 2017-04-02 — End: 2017-04-07
  Administered 2017-04-02: 4 mg/h via INTRAVENOUS
  Administered 2017-04-02: 1 mg/h via INTRAVENOUS
  Administered 2017-04-03 (×2): 4 mg/h via INTRAVENOUS
  Administered 2017-04-04: 8 mg/h via INTRAVENOUS
  Filled 2017-04-02 (×8): qty 50

## 2017-04-02 MED ORDER — LABETALOL HCL 5 MG/ML IV SOLN
INTRAVENOUS | Status: AC
  Administered 2017-04-02: 20 mg
  Filled 2017-04-02: qty 4

## 2017-04-02 MED ORDER — INSULIN ASPART 100 UNIT/ML ~~LOC~~ SOLN: 0.0000 [IU] | Freq: Every day | SUBCUTANEOUS | Status: DC

## 2017-04-02 MED ORDER — INSULIN ASPART 100 UNIT/ML ~~LOC~~ SOLN: 0.0000 [IU] | Freq: Three times a day (TID) | SUBCUTANEOUS | Status: DC

## 2017-04-02 MED ORDER — SODIUM CHLORIDE 0.9 % IV SOLN
INTRAVENOUS | Status: DC
  Administered 2017-04-02: 75 mL/h via INTRAVENOUS
  Administered 2017-04-03 – 2017-04-06 (×5): via INTRAVENOUS

## 2017-04-02 MED ORDER — ACETAMINOPHEN 325 MG PO TABS
650.0000 mg | ORAL_TABLET | ORAL | Status: DC | PRN
  Administered 2017-04-08: 650 mg via ORAL
  Filled 2017-04-02: qty 2

## 2017-04-02 MED ORDER — SENNOSIDES-DOCUSATE SODIUM 8.6-50 MG PO TABS
1.0000 | ORAL_TABLET | Freq: Two times a day (BID) | ORAL | Status: DC
  Administered 2017-04-04: 1 via ORAL
  Filled 2017-04-02 (×2): qty 1

## 2017-04-02 MED ORDER — IOPAMIDOL (ISOVUE-370) INJECTION 76%
INTRAVENOUS | Status: AC
  Administered 2017-04-02: 50 mL via INTRAVENOUS
  Filled 2017-04-02: qty 50

## 2017-04-02 MED ORDER — ACETAMINOPHEN 650 MG RE SUPP
650.0000 mg | RECTAL | Status: DC | PRN
  Administered 2017-04-11: 650 mg via RECTAL
  Filled 2017-04-02: qty 1

## 2017-04-02 MED ORDER — PANTOPRAZOLE SODIUM 40 MG IV SOLR
40.0000 mg | Freq: Every day | INTRAVENOUS | Status: DC
  Administered 2017-04-02 – 2017-04-03 (×2): 40 mg via INTRAVENOUS
  Filled 2017-04-02 (×2): qty 40

## 2017-04-02 MED ORDER — STROKE: EARLY STAGES OF RECOVERY BOOK
Freq: Once | Status: DC
  Filled 2017-04-02: qty 1

## 2017-04-02 MED ORDER — ORAL CARE MOUTH RINSE
15.0000 mL | Freq: Two times a day (BID) | OROMUCOSAL | Status: DC
  Administered 2017-04-02 – 2017-04-04 (×5): 15 mL via OROMUCOSAL

## 2017-04-02 NOTE — ED Notes (Signed)
Pt back in room. Returned from CT

## 2017-04-02 NOTE — ED Triage Notes (Addendum)
Per [facility], Pt is coming from facility [facility]. Pt was seen normal at 0930 when he went outside to smoke. At 1230, Pt was seen to have lunch and was slightly altered. At 1430, Pt was found by staff with facial droop, altered mental status, left arm and leg weakness. Vitals per EMS: 160/100, 60 HR, 100% on 4L

## 2017-04-02 NOTE — ED Provider Notes (Signed)
[facility] NEURO/[facility]AU[facility]/SURGICAL ICU Provider Note   CSN: [id number] Arrival date & time: 04/02/17  1459     History   Chief Complaint Chief Complaint  Patient presents with  . Code Stroke    HPI Antonio Flynn is a 75 y.o. male.  Level V caveat: AMS   Antonio Flynn is a 76 y.o. Male who presents to the ED via EMS as a code stroke.  Last known normal was around 9:30 am today when he went outside to smoke. He his coming from [place]. He was found to be slightly altered around 12:30 pm by staff, but they did not call EMS. Around 2:30 PM today they found him to be altered with  facial droop and left-sided arm and leg weakness.  EMS was called. He has a history of dementia, hypertension, HLD and diabetes.    The history is provided by medical records and the EMS personnel. No language interpreter was used.    Past Medical History:  Diagnosis Date  . Anxiety   . BPH (benign prostatic hyperplasia)   . Constipation   . Dementia    may have had a stroke  . Depression   . Diabetes mellitus type I ([place])   . Hyperlipidemia   . Hypertension   . Hypocalcemia   . Vitamin D deficiency     Patient Active Problem List   Diagnosis Date Noted  . ICH (intracerebral hemorrhage) ([place]) 04/02/2017  . IVH (intraventricular hemorrhage) ([place]) 04/02/2017  . Smoker 04/02/2017  . DM (diabetes mellitus) ([place]) 04/02/2017  . HLD (hyperlipidemia) 04/02/2017  . Dementia 03/06/2013  . Depression   . Dementia with behavioral problem   . Alcohol abuse, in remission   . Tobacco abuse counseling   . Diabetes mellitus, type II, insulin dependent ([place])   . HTN (hypertension)     Past Surgical History:  Procedure Laterality Date  . CATARACT EXTRACTION W/ INTRAOCULAR LENS IMPLANT         Home Medications    Prior to Admission medications   Medication Sig Start Date End Date Taking? Authorizing Provider  albuterol (PROVENTIL HFA;VENTOLIN HFA) 108 (90 BASE) MCG/ACT inhaler Inhale 2  puffs into the lungs every 6 (six) hours as needed for wheezing or shortness of breath.    Yes [provider]  amLODipine (NORVASC) 5 MG tablet Take 5 mg by mouth daily.   Yes [provider]  aspirin 325 MG tablet Take 325 mg by mouth daily.   Yes [provider]  atorvastatin (LIPITOR) 10 MG tablet Take 10 mg by mouth daily.   Yes [provider]  benztropine (COGENTIN) 0.5 MG tablet Take 0.5 mg by mouth 2 (two) times daily.   Yes [provider]  cholecalciferol (VITAMIN D) 1000 UNITS tablet Take 1,000 Units by mouth daily.   Yes [provider]  docusate sodium (COLACE) 100 MG capsule Take 100-200 mg by mouth See admin instructions. 159m daily and 2016mdaily as needed for constipation   Yes [provider]  donepezil (ARICEPT) 5 MG tablet Take 5 mg by mouth at bedtime.   Yes [provider]  fluticasone (FLONASE) 50 MCG/ACT nasal spray Place 2 sprays into both nostrils daily as needed for allergies or rhinitis.   Yes [provider]  insulin detemir (LEVEMIR) 100 UNIT/ML injection Inject 8 Units into the skin at bedtime.    Yes [provider]  lamoTRIgine (LAMICTAL) 100 MG tablet Take 0.5 tablets (50 mg total) by  mouth 2 (two) times daily. 10/21/15  Yes Merian Capron, MD  Linaclotide 145 MCG CAPS Take 145 mg by mouth every morning.   Yes [provider]  LORazepam (ATIVAN) 1 MG tablet Take 0.5 tablets (0.5 mg total) by mouth 2 (two) times daily as needed. Patient taking differently: Take 1 mg by mouth 2 (two) times daily as needed.  10/21/15  Yes Merian Capron, MD  losartan (COZAAR) 100 MG tablet Take 100 mg by mouth daily.   Yes [provider]  Memantine HCl ER (NAMENDA XR) 21 MG CP24 Take 21 mg by mouth daily.   Yes [provider]  metoprolol tartrate (LOPRESSOR) 100 MG tablet Take 100 mg by mouth every morning.   Yes [provider]  mirabegron ER (MYRBETRIQ) 50  MG TB24 tablet Take 50 mg by mouth every morning.    Yes [provider]  polyethylene glycol (MIRALAX / GLYCOLAX) packet Take 17 g by mouth daily.   Yes [provider]  potassium chloride SA (K-DUR,KLOR-CON) 20 MEQ tablet Take 20 mEq by mouth every morning.   Yes [provider]  risperiDONE (RISPERDAL) 2 MG tablet Take 2 mg by mouth at bedtime.   Yes [provider]  Tamsulosin HCl (FLOMAX) 0.4 MG CAPS Take 0.4 mg by mouth 2 (two) times daily.   Yes [provider]    Family History Family History  Problem Relation Age of Onset  . Hypertension Sister   . Hypertension Brother   . Hypertension Sister     Social History Social History   Tobacco Use  . Smoking status: Current Every Day Smoker    Packs/day: 0.33    Years: 50.00    Pack years: 16.50    Types: Cigarettes  . Tobacco comment: patches  Substance Use Topics  . Alcohol use: No  . Drug use: No    Comment: before entering [employer]     Allergies   Penicillins   Review of Systems Review of Systems  Unable to perform ROS: Mental status change     Physical Exam Updated Vital Signs BP 119/75   Pulse 85   Temp 99.3 F (37.4 C) (Axillary)   Resp (!) 28   Ht 5' 8"  (1.727 m)   Wt 66.2 kg (145 lb 15.1 oz)   SpO2 100%   BMI 22.19 kg/m   Physical Exam  Constitutional: He appears well-developed and well-nourished. No distress.  HENT:  Head: Normocephalic and atraumatic.  Mouth/Throat: Oropharynx is clear and moist.  Left facial droop.  Tongue midline.  Gag present. No drooling.   Eyes: Right eye exhibits no discharge. Left eye exhibits no discharge.  Right pupil is irregular and not reactive to light. Left pupil is reactive to light.   Neck: Neck supple. No tracheal deviation present.  Cardiovascular: Normal rate, regular rhythm, normal heart sounds and intact distal pulses.  Pulmonary/Chest: Effort normal and breath sounds normal. No respiratory distress.    Abdominal: Soft. There is no tenderness. There is no guarding.  Musculoskeletal: He exhibits no edema.  Lymphadenopathy:    He has no cervical adenopathy.  Neurological:  Sleepy and difficulty to arouse. Will awaken to loud speech and with pain. Minimal speech during my exam. When he does speak it is clear and coherent.  Left facial droop. Gag intact. Left arm and leg weakness. Following some commands.   Skin: Skin is warm and dry. Capillary refill takes less than 2 seconds. No rash noted. He is not  diaphoretic. No erythema. No pallor.  Psychiatric: He has a normal mood and affect. His behavior is normal.  Nursing note and vitals reviewed.    ED Treatments / Results  Labs (all labs ordered are listed, but only abnormal results are displayed) Labs Reviewed  CBC - Abnormal; Notable for the following components:      Result Value   Platelets 140 (*)    All other components within normal limits  COMPREHENSIVE METABOLIC PANEL - Abnormal; Notable for the following components:   Total Protein 6.4 (*)    ALT 13 (*)    GFR calc non Af Amer 56 (*)    All other components within normal limits  RAPID URINE DRUG SCREEN, HOSP PERFORMED - Abnormal; Notable for the following components:   Benzodiazepines POSITIVE (*)    All other components within normal limits  GLUCOSE, CAPILLARY - Abnormal; Notable for the following components:   Glucose-Capillary 112 (*)    All other components within normal limits  MRSA PCR SCREENING  ETHANOL  PROTIME-INR  APTT  DIFFERENTIAL  URINALYSIS, ROUTINE W REFLEX MICROSCOPIC  TSH  CBC  BASIC METABOLIC PANEL  HEMOGLOBIN A1C  LIPID PANEL  VITAMIN B12  I-STAT CHEM 8, ED  I-STAT TROPONIN, ED  CBG MONITORING, ED    EKG  EKG Interpretation  Date/Time:  Sunday April 02 2017 15:36:49 EST Ventricular Rate:  66 PR Interval:    QRS Duration: 105 QT Interval:  434 QTC Calculation: 455 R Axis:   -54 Text Interpretation:  Sinus rhythm LAD, consider left  anterior fascicular block Confirmed by Lajean Saver [phone]) on 04/02/2017 6:10:56 PM       Radiology Ct Angio Head W Or Wo Contrast  Result Date: 04/02/2017 CLINICAL DATA:  Stroke.  Intracranial hemorrhage EXAM: CT HEAD WITHOUT CONTRAST CT ANGIOGRAPHY OF THE HEAD AND NECK TECHNIQUE: Contiguous axial images were obtained from the base of the skull through the vertex without intravenous contrast. Multidetector CT imaging of the head and neck was performed using the standard protocol during bolus administration of intravenous contrast. Multiplanar CT image reconstructions and MIPs were obtained to evaluate the vascular anatomy. Carotid stenosis measurements (when applicable) are obtained utilizing NASCET criteria, using the distal internal carotid diameter as the denominator. CONTRAST:  38m ISOVUE-370 IOPAMIDOL (ISOVUE-370) INJECTION 76%<Contrast>514mISOVUE-370 IOPAMIDOL (ISOVUE-370) INJECTION 76% COMPARISON:  CT head 04/02/2017 FINDINGS: CTA NECK Aortic arch: Minimal atherosclerotic disease aortic arch Right carotid system: Right common carotid artery widely patent. Minimal atherosclerotic disease right carotid bifurcation without stenosis Left carotid system: Left common carotid artery widely patent and tortuous. Left carotid bifurcation patent without stenosis. Mild atherosclerotic disease at the bifurcation Vertebral arteries:Left vertebral artery is dominant and widely patent to the basilar. Small right vertebral artery with minimal basilar contribution. No significant vertebral artery stenosis bilaterally. Skeleton: ACDF C4 through C6.  No acute skeletal abnormality. Other neck: Negative CTA HEAD Anterior circulation: Atherosclerotic calcification cavernous carotid bilaterally with mild stenosis bilaterally. Anterior and middle cerebral artery patent bilaterally without stenosis or large vessel occlusion. No vascular malformation. Posterior circulation: Left vertebral artery dominant. Small right  vertebral artery with minimal contribution to the basilar. Basilar widely patent. PICA, AICA, superior cerebellar, and posterior cerebral artery patent bilaterally. Fetal origin right posterior cerebral artery. Negative for vascular malformation in the posterior circulation. Venous sinuses: Patent Anatomic variants: None Delayed phase: Not performed IMPRESSION: No intracranial aneurysm or vascular malformation to account for the hemorrhage in the right thalamus and midbrain. Negative for emergent large vessel occlusion.  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 No significant carotid or vertebral artery stenosis in the neck. Electronically Signed   By: Franchot Gallo M.D.   On: 04/02/2017 15:51   Ct Angio Neck W Or Wo Contrast  Result Date: 04/02/2017 CLINICAL DATA:  Stroke.  Intracranial hemorrhage EXAM: CT HEAD WITHOUT CONTRAST CT ANGIOGRAPHY OF THE HEAD AND NECK TECHNIQUE: Contiguous axial images were obtained from the base of the skull through the vertex without intravenous contrast. Multidetector CT imaging of the head and neck was performed using the standard protocol during bolus administration of intravenous contrast. Multiplanar CT image reconstructions and MIPs were obtained to evaluate the vascular anatomy. Carotid stenosis measurements (when applicable) are obtained utilizing NASCET criteria, using the distal internal carotid diameter as the denominator. CONTRAST:  41m ISOVUE-370 IOPAMIDOL (ISOVUE-370) INJECTION 76%<Contrast>562mISOVUE-370 IOPAMIDOL (ISOVUE-370) INJECTION 76% COMPARISON:  CT head 04/02/2017 FINDINGS: CTA NECK Aortic arch: Minimal atherosclerotic disease aortic arch Right carotid system: Right common carotid artery widely patent. Minimal atherosclerotic disease right carotid bifurcation without stenosis Left carotid system: Left common carotid artery widely patent and tortuous. Left carotid bifurcation patent without stenosis. Mild  atherosclerotic disease at the bifurcation Vertebral arteries:Left vertebral artery is dominant and widely patent to the basilar. Small right vertebral artery with minimal basilar contribution. No significant vertebral artery stenosis bilaterally. Skeleton: ACDF C4 through C6.  No acute skeletal abnormality. Other neck: Negative CTA HEAD Anterior circulation: Atherosclerotic calcification cavernous carotid bilaterally with mild stenosis bilaterally. Anterior and middle cerebral artery patent bilaterally without stenosis or large vessel occlusion. No vascular malformation. Posterior circulation: Left vertebral artery dominant. Small right vertebral artery with minimal contribution to the basilar. Basilar widely patent. PICA, AICA, superior cerebellar, and posterior cerebral artery patent bilaterally. Fetal origin right posterior cerebral artery. Negative for vascular malformation in the posterior circulation. Venous sinuses: Patent Anatomic variants: None Delayed phase: Not performed IMPRESSION: No intracranial aneurysm or vascular malformation to account for the hemorrhage in the right thalamus and midbrain. Negative for emergent large vessel occlusion. 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 No significant carotid or vertebral artery stenosis in the neck. Electronically Signed   By: ChFranchot Gallo.D.   On: 04/02/2017 15:51   Ct Head Code Stroke Wo Contrast  Addendum Date: 04/02/2017   ADDENDUM REPORT: 04/02/2017 15:43 ADDENDUM: These results were called by telephone at the time of interpretation on 04/02/2017 at 3:43 pm to Dr. XuErlinda Hongwho verbally acknowledged these results. Electronically Signed   By: ChFranchot Gallo.D.   On: 04/02/2017 15:43   Result Date: 04/02/2017 CLINICAL DATA:  Code stroke. Focal neuro deficit less than 6 hours, suspect stroke EXAM: CT HEAD WITHOUT CONTRAST TECHNIQUE: Contiguous axial images were obtained from the base of the skull through the  vertex without intravenous contrast. COMPARISON:  None. FINDINGS: Brain: Acute hemorrhage in the midbrain measuring 15 mm with extension into the ventricles. Blood in the third and lateral ventricles without hydrocephalus. Generalized atrophy. Chronic microvascular ischemic change with hypodensity throughout the cerebral white matter bilaterally. No acute ischemic infarct identified. No mass lesion. No midline shift. Vascular: Negative for hyperdense vessel Skull: No acute abnormality Sinuses/Orbits: Mild mucosal edema maxillary sinus bilaterally. Bilateral lens replacement. Note is made of complete pneumatization of the clivus with marked thinning of the surrounding bone. The posterior wall of the clivus is not visualized. There is communication with the sphenoid sinus. Question prior sinus surgery. No evidence of mass or enlargement of the sella.  Other: None IMPRESSION: 1. Acute hemorrhage in the midbrain with extension at the ventricles. Negative for hydrocephalus. This hemorrhage is most likely due to hypertension, vascular malformation possible 2. Atrophy and moderate chronic microvascular ischemia. 3. Complete pneumatization of the clivus with an unusual appearance. Question postsurgical defect in the posterior wall of the sphenoid sinus leading to pneumatization of the clivus. Electronically Signed: By: Franchot Gallo M.D. On: 04/02/2017 15:35    Procedures Procedures (including critical care time)  Medications Ordered in ED Medications  clevidipine (CLEVIPREX) infusion 0.5 mg/mL (8 mg/hr Intravenous Rate/Dose Change 04/02/17 1647)  ondansetron (ZOFRAN) injection 4 mg (not administered)   stroke: mapping our early stages of recovery book (not administered)  acetaminophen (TYLENOL) tablet 650 mg (not administered)    Or  acetaminophen (TYLENOL) solution 650 mg (not administered)    Or  acetaminophen (TYLENOL) suppository 650 mg (not administered)  senna-docusate (Senokot-S) tablet 1 tablet (not  administered)  pantoprazole (PROTONIX) injection 40 mg (not administered)  0.9 %  sodium chloride infusion (75 mL/hr Intravenous New Bag/Given 04/02/17 1621)  insulin aspart (novoLOG) injection 0-9 Units (0 Units Subcutaneous Not Given 04/02/17 1731)  insulin aspart (novoLOG) injection 0-5 Units (not administered)  iopamidol (ISOVUE-370) 76 % injection (50 mLs Intravenous Contrast Given 04/02/17 1516)  labetalol (NORMODYNE,TRANDATE) 5 MG/ML injection (20 mg  Given 04/02/17 1518)     Initial Impression / Assessment and Plan / ED Course  I have reviewed the triage vital signs and the nursing notes.  Pertinent labs & imaging results that were available during my care of the patient were reviewed by me and considered in my medical decision making (see chart for details).    This is a 76 y.o. Male who presents to the ED via EMS as a code stroke.  Last known normal was around 9:30 am today when he went outside to smoke. He his coming from [place]. He was found to be slightly altered around 12:30 pm by staff, but they did not call EMS. Around 2:30 PM today they found him to be altered with  facial droop and left-sided arm and leg weakness.  I first met patient in CT scanner with neurology team. CT scans being done.  On exam the patient is afebrile nontoxic-appearing.  He has left facial droop.  Left arm and leg weakness.  He will aroused with loud voice or painful stimuli.  Minimal speech during my exam.  When he does speak it is clear and coherent.  Gag present.  Respirations normal bilaterally.  Abdomen soft and nontender.  No drooling.  No lower extremity edema. Blood work reassuring. Creatinine 1.22.  CT head shows ICH with IVH. Plan for admission to ICU. Dr. Erlinda Hong from neurology admitting.   Final Clinical Impressions(s) / ED Diagnoses   Final diagnoses:  Nontraumatic multiple localized intracerebral hemorrhages, unspecified laterality [facility])    ED Discharge Orders    None         Waynetta Pean, PA-C 04/02/17 1814    Lajean Saver, MD 04/02/17 [phone]

## 2017-04-02 NOTE — H&P (Addendum)
Stroke Neurology H&P Note  The history was obtained from the EMS staff.  During history and examination, all items were obtained unless otherwise noted.  History of Present Illness:  Antonio Flynn is a 76 y.o. African American male with PMH of HTN, HLD, DM, dementia, prostate cancer presented with AMS with left sided weakness and facial droop.   As per EMS staff, pt lives in SNF, and was at baseline at 9:30am when he went out for smoking. At 12:30pm he was found to have mild AMS with sleepy but not otherwise alarming. Around 2:30pm, pt became progressively altered, difficult arouse with left sided weakness and facial droop. EMS called and pt sent over to [facility] for further evaluation. CT head showed right thalamus, midbrain ICH with IVH.   Pt has hx of smoking and alcohol abuse. He has been followed with psychiatry for dementia with behavior disturbance and currently on nemanda and lamictal. On lipitor for HLD, levemir for DM, and norvasc / HCTZ for HTN. He has remote hx of prostate cancer and following with urology. Has hx of laser eye surgery for cataract in the past in [facility] (sister said both eyes).  LSN: 9:30am tPA Given: No: ICH ICH score = 2  Past Medical History:  Diagnosis Date  . Constipation   . Dementia    may have had a stroke  . Depression   . Diabetes mellitus type I ([place])   . Hypertension     Past Surgical History:  Procedure Laterality Date  . CATARACT EXTRACTION W/ INTRAOCULAR LENS IMPLANT      Family History  Problem Relation Age of Onset  . Hypertension Sister   . Hypertension Brother   . Hypertension Sister     Social History:  reports that he has been smoking cigarettes.  He has a 16.50 pack-year smoking history. He does not have any smokeless tobacco history on file. He reports that he does not drink alcohol or use drugs.  Allergies:  Allergies  Allergen Reactions  . Penicillins     No current facility-administered medications on file prior to  encounter.    Current Outpatient Medications on File Prior to Encounter  Medication Sig Dispense Refill  . albuterol (PROVENTIL HFA;VENTOLIN HFA) 108 (90 BASE) MCG/ACT inhaler Inhale 2 puffs into the lungs every 6 (six) hours as needed.    Marland Kitchen amLODipine (NORVASC) 5 MG tablet Take 5 mg by mouth daily.    Marland Kitchen aspirin 325 MG tablet Take 325 mg by mouth daily.    Marland Kitchen azelastine (ASTELIN) 0.1 % nasal spray Place into the nose.    . cetirizine (ZYRTEC) 10 MG tablet Take 10 mg by mouth daily.    . cholecalciferol (VITAMIN D) 1000 UNITS tablet Take 1,000 Units by mouth daily.    Marland Kitchen docusate sodium (COLACE) 100 MG capsule Take 100 mg by mouth every morning.    Marland Kitchen guaiFENesin (MUCINEX) 600 MG 12 hr tablet Take 600 mg by mouth 2 (two) times daily.    . hydrochlorothiazide (HYDRODIURIL) 25 MG tablet Take 25 mg by mouth daily.    . insulin aspart (NOVOLOG) 100 UNIT/ML injection Inject 2 Units into the skin 3 (three) times daily before meals.    . insulin detemir (LEVEMIR) 100 UNIT/ML injection Inject 8 Units into the skin at bedtime.     . lamoTRIgine (LAMICTAL) 100 MG tablet Take 0.5 tablets (50 mg total) by mouth 2 (two) times daily. 60 tablet 0  . Linaclotide 145 MCG CAPS Take 145 mg  by mouth every morning.    Marland Kitchen LORazepam (ATIVAN) 1 MG tablet Take 0.5 tablets (0.5 mg total) by mouth 2 (two) times daily as needed. 60 tablet 1  . losartan (COZAAR) 100 MG tablet Take 100 mg by mouth daily.    . Memantine HCl ER (NAMENDA XR) 21 MG CP24 Take 21 mg by mouth daily.    . metoprolol succinate (TOPROL-XL) 100 MG 24 hr tablet Take 100 mg by mouth daily. Take with or immediately following a meal.    . mirabegron ER (MYRBETRIQ) 25 MG TB24 tablet Take 50 mg by mouth daily.    . naproxen (NAPROSYN) 500 MG tablet Take 500 mg by mouth daily as needed.    Marland Kitchen omeprazole (PRILOSEC) 20 MG capsule Take 20 mg by mouth daily.    . potassium chloride SA (K-DUR,KLOR-CON) 20 MEQ tablet Take 20 mEq by mouth every morning.    .  Tamsulosin HCl (FLOMAX) 0.4 MG CAPS Take 0.4 mg by mouth 2 (two) times daily.      Review of Systems: A full ROS was attempted today and was not able to be performed.  Systems assessed include - Constitutional, Eyes,  HENT, Respiratory, Cardiovascular, Gastrointestinal, Genitourinary, Integument/breast, Hematologic/lymphatic, Musculoskeletal, Neurological, Behavioral/Psych, Endocrine, Allergic/Immunologic - with pertinent responses as per HPI.  Physical Examination: Pulse Rate:  [62-70] 70 (12/30 1520) Resp:  [15-24] 23 (12/30 1600) BP: (142-166)/(87-105) 142/105 (12/30 1600) SpO2:  [96 %] 96 % (12/30 1520) General - drowsy sleepy, difficult arouse but he did briefly open eyes on repetitive stimulation.  Ophthalmologic - fundi not visualized due to noncooperation.    Cardiovascular - regular rate and rhythm.  Neuro - drowsy and sleepy, difficult arouse, answered month wrong but correct on age. Not able to follow most simple commands. Lack of spontaneous speech. Pupil left 1.16mm sluggish to light, right pupil 88mm irregular shape not reactive to light (as per sister, bilateral eye surgeries in the past). Not blinking to visual threat on the left. Left facial droop, eye mid position, doll's eye sluggish. Tongue in the middle. Positive corneal and gag. Right UE 4/5 and right LE 3/5. However, LUE drift to bed and LLE 0/5, no significant withdraw to pain. DTR 1+ and no babinski. Sensation, coordination and gait not tested.  Data Reviewed: Ct Angio Head W Or Wo Contrast  Result Date: 04/02/2017 CLINICAL DATA:  Stroke.  Intracranial hemorrhage EXAM: CT HEAD WITHOUT CONTRAST CT ANGIOGRAPHY OF THE HEAD AND NECK TECHNIQUE: Contiguous axial images were obtained from the base of the skull through the vertex without intravenous contrast. Multidetector CT imaging of the head and neck was performed using the standard protocol during bolus administration of intravenous contrast. Multiplanar CT image  reconstructions and MIPs were obtained to evaluate the vascular anatomy. Carotid stenosis measurements (when applicable) are obtained utilizing NASCET criteria, using the distal internal carotid diameter as the denominator. CONTRAST:  51mL ISOVUE-370 IOPAMIDOL (ISOVUE-370) INJECTION 76%<Contrast>57mL ISOVUE-370 IOPAMIDOL (ISOVUE-370) INJECTION 76% COMPARISON:  CT head 04/02/2017 FINDINGS: CTA NECK Aortic arch: Minimal atherosclerotic disease aortic arch Right carotid system: Right common carotid artery widely patent. Minimal atherosclerotic disease right carotid bifurcation without stenosis Left carotid system: Left common carotid artery widely patent and tortuous. Left carotid bifurcation patent without stenosis. Mild atherosclerotic disease at the bifurcation Vertebral arteries:Left vertebral artery is dominant and widely patent to the basilar. Small right vertebral artery with minimal basilar contribution. No significant vertebral artery stenosis bilaterally. Skeleton: ACDF C4 through C6.  No acute skeletal abnormality. Other neck: Negative  CTA HEAD Anterior circulation: Atherosclerotic calcification cavernous carotid bilaterally with mild stenosis bilaterally. Anterior and middle cerebral artery patent bilaterally without stenosis or large vessel occlusion. No vascular malformation. Posterior circulation: Left vertebral artery dominant. Small right vertebral artery with minimal contribution to the basilar. Basilar widely patent. PICA, AICA, superior cerebellar, and posterior cerebral artery patent bilaterally. Fetal origin right posterior cerebral artery. Negative for vascular malformation in the posterior circulation. Venous sinuses: Patent Anatomic variants: None Delayed phase: Not performed IMPRESSION: No intracranial aneurysm or vascular malformation to account for the hemorrhage in the right thalamus and midbrain. Negative for emergent large vessel occlusion. Atherosclerotic disease and mild stenosis in the  cavernous carotid bilaterally. No significant carotid or vertebral artery stenosis in the neck. Electronically Signed   By: Franchot Gallo M.D.   On: 04/02/2017 15:51   Ct Angio Neck W Or Wo Contrast  Result Date: 04/02/2017 CLINICAL DATA:  Stroke.  Intracranial hemorrhage EXAM: CT HEAD WITHOUT CONTRAST CT ANGIOGRAPHY OF THE HEAD AND NECK TECHNIQUE: Contiguous axial images were obtained from the base of the skull through the vertex without intravenous contrast. Multidetector CT imaging of the head and neck was performed using the standard protocol during bolus administration of intravenous contrast. Multiplanar CT image reconstructions and MIPs were obtained to evaluate the vascular anatomy. Carotid stenosis measurements (when applicable) are obtained utilizing NASCET criteria, using the distal internal carotid diameter as the denominator. CONTRAST:  85mL ISOVUE-370 IOPAMIDOL (ISOVUE-370) INJECTION 76%<Contrast>23mL ISOVUE-370 IOPAMIDOL (ISOVUE-370) INJECTION 76% COMPARISON:  CT head 04/02/2017 FINDINGS: CTA NECK Aortic arch: Minimal atherosclerotic disease aortic arch Right carotid system: Right common carotid artery widely patent. Minimal atherosclerotic disease right carotid bifurcation without stenosis Left carotid system: Left common carotid artery widely patent and tortuous. Left carotid bifurcation patent without stenosis. Mild atherosclerotic disease at the bifurcation Vertebral arteries:Left vertebral artery is dominant and widely patent to the basilar. Small right vertebral artery with minimal basilar contribution. No significant vertebral artery stenosis bilaterally. Skeleton: ACDF C4 through C6.  No acute skeletal abnormality. Other neck: Negative CTA HEAD Anterior circulation: Atherosclerotic calcification cavernous carotid bilaterally with mild stenosis bilaterally. Anterior and middle cerebral artery patent bilaterally without stenosis or large vessel occlusion. No vascular malformation.  Posterior circulation: Left vertebral artery dominant. Small right vertebral artery with minimal contribution to the basilar. Basilar widely patent. PICA, AICA, superior cerebellar, and posterior cerebral artery patent bilaterally. Fetal origin right posterior cerebral artery. Negative for vascular malformation in the posterior circulation. Venous sinuses: Patent Anatomic variants: None Delayed phase: Not performed IMPRESSION: No intracranial aneurysm or vascular malformation to account for the hemorrhage in the right thalamus and midbrain. Negative for emergent large vessel occlusion. 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 No significant carotid or vertebral artery stenosis in the neck. Electronically Signed   By: Franchot Gallo M.D.   On: 04/02/2017 15:51   Ct Head Code Stroke Wo Contrast  Addendum Date: 04/02/2017   ADDENDUM REPORT: 04/02/2017 15:43 ADDENDUM: These results were called by telephone at the time of interpretation on 04/02/2017 at 3:43 pm to Dr. Erlinda Hong, who verbally acknowledged these results. Electronically Signed   By: Franchot Gallo M.D.   On: 04/02/2017 15:43   Result Date: 04/02/2017 CLINICAL DATA:  Code stroke. Focal neuro deficit less than 6 hours, suspect stroke EXAM: CT HEAD WITHOUT CONTRAST TECHNIQUE: Contiguous axial images were obtained from the base of the skull through the vertex without intravenous contrast. COMPARISON:  None. FINDINGS: Brain: Acute hemorrhage in the midbrain  measuring 15 mm with extension into the ventricles. Blood in the third and lateral ventricles without hydrocephalus. Generalized atrophy. Chronic microvascular ischemic change with hypodensity throughout the cerebral white matter bilaterally. No acute ischemic infarct identified. No mass lesion. No midline shift. Vascular: Negative for hyperdense vessel Skull: No acute abnormality Sinuses/Orbits: Mild mucosal edema maxillary sinus bilaterally. Bilateral lens  replacement. Note is made of complete pneumatization of the clivus with marked thinning of the surrounding bone. The posterior wall of the clivus is not visualized. There is communication with the sphenoid sinus. Question prior sinus surgery. No evidence of mass or enlargement of the sella. Other: None IMPRESSION: 1. Acute hemorrhage in the midbrain with extension at the ventricles. Negative for hydrocephalus. This hemorrhage is most likely due to hypertension, vascular malformation possible 2. Atrophy and moderate chronic microvascular ischemia. 3. Complete pneumatization of the clivus with an unusual appearance. Question postsurgical defect in the posterior wall of the sphenoid sinus leading to pneumatization of the clivus. Electronically Signed: By: Franchot Gallo M.D. On: 04/02/2017 15:35    Assessment: 76 y.o. male with PMH of HTN, HLD, DM, dementia, prostate cancer presented with AMS with left sided weakness and facial droop. Exam showed very sleepy drowsy left hemianopia, left facial droop and left hemiparesis. CT showed right thalamus and midbrain ICH with IVH, no hydrocephalus. BP at 160s, given labetalol 120. Will also start cleviprex.   Stroke Risk Factors - diabetes mellitus, hyperlipidemia, hypertension and smoking  Plan: - admit to [place] for close monitoring - BP control, goal < 140 - on cleviprex - repeat CT head in 8 hours or earlier if acute neuro change - consult NSG if hydrocephalus - keep NPO and start IVF - HgbA1c, fasting lipid panel - PT consult, OT consult, Speech consult - Echocardiogram - SCDs - Telemetry monitoring - Frequent neuro checks  Rosalin Hawking, MD PhD Stroke Neurology 04/02/2017 4:17 PM  This patient is critically ill due to [place] with IVH, HTN and at significant risk of neurological worsening, death form hematoma expansion, hydrocephalus, respiratory failure. This patient's care requires constant monitoring of vital signs, hemodynamics, respiratory and cardiac  monitoring, review of multiple databases, neurological assessment, discussion with family, other specialists and medical decision making of high complexity. I spent 50 minutes of neurocritical care time in the care of this patient.

## 2017-04-02 NOTE — Code Documentation (Signed)
76 year old male presents to [facility] via [facility] as code stroke called in the field at 63.  EMS reports patient was last seen well at 0930 this AM when he went outside to smoke.  At 1230 he was somewhat altered.  At 1430 he was found by staff at facility to have left facial droop with severely altered mental status and left arm leg and weakness.  On arrival to ED he is lethargic - arouses to deep pain - left facial droop - left leg/arm weakness with severe aphasia and dysarthria - left field cut. Dr. Erlinda Hong at bedside.   CT scan shows mid-brain bleed. Labetalol 20 mg IV was given in CT scanner for BP control.   CTA was done.  NIHSS 20.  HOB elevated post CT and taken to ED.  Some slurred speech noted - still with severe aphasia. No acute treatment.  For admission to ICU for BP control.  Handoff to [employer].

## 2017-04-03 ENCOUNTER — Inpatient Hospital Stay (HOSPITAL_COMMUNITY): Payer: Medicare Other

## 2017-04-03 DIAGNOSIS — Z794 Long term (current) use of insulin: Secondary | ICD-10-CM

## 2017-04-03 DIAGNOSIS — F0391 Unspecified dementia with behavioral disturbance: Secondary | ICD-10-CM

## 2017-04-03 DIAGNOSIS — I361 Nonrheumatic tricuspid (valve) insufficiency: Secondary | ICD-10-CM

## 2017-04-03 LAB — LIPID PANEL
CHOL/HDL RATIO: 2.7 ratio
CHOLESTEROL: 121 mg/dL (ref 0–200)
HDL: 45 mg/dL (ref >40)
LDL CALC: 66 mg/dL (ref 0–99)
TRIGLYCERIDES: 49 mg/dL (ref <150)
VLDL: 10 mg/dL (ref 0–40)

## 2017-04-03 LAB — BASIC METABOLIC PANEL
Anion gap: 9 (ref 5–15)
BUN: 6 mg/dL (ref 6–20)
CO2: 23 mmol/L (ref 22–32)
Calcium: 9.1 mg/dL (ref 8.9–10.3)
Chloride: 113 mmol/L — ABNORMAL HIGH (ref 101–111)
Creatinine, Ser: 1.2 mg/dL (ref 0.61–1.24)
GFR calc Af Amer: >60 mL/min (ref >60)
GFR, EST NON AFRICAN AMERICAN: 57 mL/min — AB (ref >60)
GLUCOSE: 111 mg/dL — AB (ref 65–99)
POTASSIUM: 3.6 mmol/L (ref 3.5–5.1)
Sodium: 145 mmol/L (ref 135–145)

## 2017-04-03 LAB — CBC
HEMATOCRIT: 41.7 % (ref 39.0–52.0)
Hemoglobin: 14.3 g/dL (ref 13.0–17.0)
MCH: 29.7 pg (ref 26.0–34.0)
MCHC: 34.3 g/dL (ref 30.0–36.0)
MCV: 86.5 fL (ref 78.0–100.0)
PLATELETS: 153 10*3/uL (ref 150–400)
RBC: 4.82 MIL/uL (ref 4.22–5.81)
RDW: 14.9 % (ref 11.5–15.5)
WBC: 7.9 10*3/uL (ref 4.0–10.5)

## 2017-04-03 LAB — ECHOCARDIOGRAM COMPLETE
Height: 68 in
WEIGHTICAEL: 2335.11 [oz_av]

## 2017-04-03 LAB — VITAMIN B12: Vitamin B-12: 256 pg/mL (ref 180–914)

## 2017-04-03 LAB — GLUCOSE, CAPILLARY
GLUCOSE-CAPILLARY: 102 mg/dL — AB (ref 65–99)
GLUCOSE-CAPILLARY: 98 mg/dL (ref 65–99)
Glucose-Capillary: 88 mg/dL (ref 65–99)
Glucose-Capillary: 92 mg/dL (ref 65–99)

## 2017-04-03 LAB — POCT I-STAT TROPONIN I: TROPONIN I, POC: 0 ng/mL (ref 0.00–0.08)

## 2017-04-03 LAB — HEMOGLOBIN A1C
Hgb A1c MFr Bld: 6 % — ABNORMAL HIGH (ref 4.8–5.6)
Mean Plasma Glucose: 125.5 mg/dL

## 2017-04-03 MED ORDER — MIDAZOLAM HCL 2 MG/2ML IJ SOLN
INTRAMUSCULAR | Status: AC
  Administered 2017-04-03: 2 mg
  Filled 2017-04-03: qty 2

## 2017-04-03 MED ORDER — MORPHINE SULFATE (PF) 4 MG/ML IV SOLN
INTRAVENOUS | Status: AC
  Administered 2017-04-03: 4 mg via INTRAVENOUS
  Filled 2017-04-03: qty 1

## 2017-04-03 MED ORDER — MORPHINE SULFATE (PF) 4 MG/ML IV SOLN
4.0000 mg | Freq: Once | INTRAVENOUS | Status: AC
  Administered 2017-04-03: 4 mg via INTRAVENOUS

## 2017-04-03 MED ORDER — MIDAZOLAM HCL 5 MG/ML IJ SOLN
2.0000 mg | Freq: Once | INTRAMUSCULAR | Status: AC
  Administered 2017-04-03: 2 mg via INTRAVENOUS

## 2017-04-03 MED ORDER — INSULIN ASPART 100 UNIT/ML ~~LOC~~ SOLN: 0.0000 [IU] | SUBCUTANEOUS | Status: DC

## 2017-04-03 NOTE — Progress Notes (Signed)
PT Cancellation Note  Patient Details Name: Antonio Flynn MRN: [id number] DOB: [birth year]   Cancelled Treatment:    Reason Eval/Treat Not Completed: Medical issues which prohibited therapy(pt currently on bedrest and await increased activity order)   Elizer Bostic B Nagi Furio 04/03/2017, 7:29 AM  Elwyn Reach, [place]

## 2017-04-03 NOTE — Progress Notes (Addendum)
S/O:  Called by RN regarding tachycardia of 120's - 130'sfollowing initiation of Cleviprex gtt, which was subsequently decreased with HR drop to 90's. Subsequently, patient had a SBP spike to 190's with tachypnea and recurrence of tachycardia. He also experienced worsened lethargy, no longer answering questions but still following some commands. STAT CT head was ordered.   Repeated questioning of patient while in CT reveals that he may be a heavy alcohol user; he answers affirmatively to all 3 when asked if he drinks beer, wine or liquor. This information is of uncertain reliability given the patient's lethargy.  BP (!) 133/91   Pulse (!) 121   Temp 100.1 F (37.8 C) (Axillary)   Resp (!) 34   Ht 5\' 8"  (1.727 m)   Wt 66.2 kg (145 lb 15.1 oz)   SpO2 93%   BMI 22.19 kg/m   Neurological Exam: Ment: Lethargic with eyes closed. Briefly opens eyes partially to noxious stimulation. Will not answer questions except inconsistently with hypophonic and dysarthric yes/no replies. Level of alertness increases slightly with tactile stimulation. Relative to prior exam documented by Dr. Erlinda Hong, continues to be able to follow limited simple commands. CN: Pupil left 66mm irregular shape pupil, not reactive to light, right pupil 64mm, not reactive to light; unchanged from prior documented exam. No consistent blink to threat. Continues to exhibit significant left facial droop. Eyes mid position without forced gaze deviation. Motor: Will grip and flex arms bilaterally to command: RUE 4/5. RLE vigorous withdrawal to noxious. LUE 3-4/5 with weak withdrawal of LLE to noxious.   A/R: 1. Preliminary assessment of STAT CT reveals an acute change in the sizes of the lateral and 3rd ventricles in conjunction with slight increase in the size of the right thalamic ICH; there is new pooling of blood within the left occipital horn.  2. Will call Neurosurgery for STAT consult regarding placement of a ventricular drain. 3. CIWA  protocol.  35 minutes spent in the emergent Neurological evaluation and management of this critically ill patient. Time spent included coordination of care.     Electronically signed: Dr. Kerney Elbe

## 2017-04-03 NOTE — Progress Notes (Signed)
OT Cancellation    04/03/17 0700  OT Visit Information  Last OT Received On 04/03/17  Reason Eval/Treat Not Completed Patient not medically ready. Pt with active bedrest orders. Will await increase in activity orders prior to initiating OT evaluation. Thank you.   [place], OTR/L Acute Rehab Pager: [phone] Office: [phone]

## 2017-04-03 NOTE — Evaluation (Signed)
Speech Language Pathology Evaluation Patient Details Name: Antonio Flynn MRN: [id number] DOB: 10-23-1940 Today's Date: 04/03/2017 Time: 1442-1500 SLP Time Calculation (min) (ACUTE ONLY): 18 min  Problem List:  Patient Active Problem List   Diagnosis Date Noted  . ICH (intracerebral hemorrhage) ([place]) 04/02/2017  . IVH (intraventricular hemorrhage) ([place]) 04/02/2017  . Smoker 04/02/2017  . DM (diabetes mellitus) ([place]) 04/02/2017  . HLD (hyperlipidemia) 04/02/2017  . Dementia 03/06/2013  . Depression   . Dementia with behavioral problem   . Alcohol abuse, in remission   . Tobacco abuse counseling   . Diabetes mellitus, type II, insulin dependent ([place])   . HTN (hypertension)    Past Medical History:  Past Medical History:  Diagnosis Date  . Anxiety   . BPH (benign prostatic hyperplasia)   . Constipation   . Dementia    may have had a stroke  . Depression   . Diabetes mellitus type I ([place])   . Hyperlipidemia   . Hypertension   . Hypocalcemia   . Vitamin D deficiency    Past Surgical History:  Past Surgical History:  Procedure Laterality Date  . CATARACT EXTRACTION W/ INTRAOCULAR LENS IMPLANT     HPI:  76 year old male admitted from SNF with left facial droop, left arm/leg weakness, severely altered mentation, severe aphasia and dysarthria. CT scan shows right thalamus, mid-brain ICH with IVH. PMH includes dementia and ETOH.    Assessment / Plan / Recommendation Clinical Impression  Patient presents with moderate dysarthria related to severe left sided facial weakness and cognitive deficits impacting attention, awareness, memory, and problem solving. Patient initially alert, interactive in conversation with clinician with intact ability to follow basic 1-step commands and without evidence of aphasia. Poor sustained attention however combined with increasing lethargy as evaluation progressed resulted in limited verbal ouput, poor focused attention, and an inability to follow  commands. Unclear extent of cognitive impairments related to dementia at baseline. Patient will benefit from ongoing SLP services focused on making needs/wants known and carryout of basic ADLs.     SLP Assessment  SLP Recommendation/Assessment: Patient needs continued Speech Lanaguage Pathology Services SLP Visit Diagnosis: Cognitive communication deficit (R41.841)    Follow Up Recommendations  Skilled Nursing facility    Frequency and Duration min 2x/week  2 weeks      SLP Evaluation Cognition  Overall Cognitive Status: Impaired/Different from baseline Arousal/Alertness: Lethargic Orientation Level: Oriented to person;Oriented to place;Disoriented to time;Disoriented to situation Attention: Sustained Sustained Attention: Impaired Sustained Attention Impairment: Verbal basic;Functional basic Memory: Impaired Memory Impairment: Storage deficit Awareness: Impaired Awareness Impairment: Intellectual impairment Problem Solving: Impaired Problem Solving Impairment: Verbal basic;Functional basic       Comprehension  Auditory Comprehension Overall Auditory Comprehension: Impaired Yes/No Questions: Impaired Basic Biographical Questions: 26-50% accurate Basic Immediate Environment Questions: 25-49% accurate Commands: Impaired One Step Basic Commands: 75-100% accurate Interfering Components: Attention(lethargy) Visual Recognition/Discrimination Discrimination: Not tested Reading Comprehension Reading Status: Not tested    Expression Expression Primary Mode of Expression: Verbal Verbal Expression Overall Verbal Expression: Appears within functional limits for tasks assessed(see impression statement)   Oral / Motor  Oral Motor/Sensory Function Overall Oral Motor/Sensory Function: Severe impairment Facial ROM: Reduced right;Suspected CN VII (facial) dysfunction Facial Symmetry: Abnormal symmetry right;Suspected CN VII (facial) dysfunction Facial Strength: Reduced left;Suspected  CN VII (facial) dysfunction Lingual ROM: Reduced left;Suspected CN XII (hypoglossal) dysfunction Lingual Symmetry: Abnormal symmetry left;Suspected CN XII (hypoglossal) dysfunction Lingual Strength: Reduced;Suspected CN XII (hypoglossal) dysfunction Mandible: Impaired Motor Speech Overall Motor Speech: Impaired  Respiration: Within functional limits Phonation: Low vocal intensity Resonance: Within functional limits Articulation: Impaired Level of Impairment: Word Intelligibility: Intelligibility reduced Word: 75-100% accurate Phrase: 75-100% accurate   GO                   Antonio Rainwater MA, CCC-SLP [phone]  Antonio Flynn Antonio Flynn 04/03/2017, 3:13 PM

## 2017-04-03 NOTE — Evaluation (Signed)
Clinical/Bedside Swallow Evaluation Patient Details  Name: Antonio Flynn MRN: [id number] Date of Birth: 01-Jul-1940  Today's Date: 04/03/2017 Time: SLP Start Time (ACUTE ONLY): 2542 SLP Stop Time (ACUTE ONLY): 1500 SLP Time Calculation (min) (ACUTE ONLY): 18 min  Past Medical History:  Past Medical History:  Diagnosis Date  . Anxiety   . BPH (benign prostatic hyperplasia)   . Constipation   . Dementia    may have had a stroke  . Depression   . Diabetes mellitus type I ([place])   . Hyperlipidemia   . Hypertension   . Hypocalcemia   . Vitamin D deficiency    Past Surgical History:  Past Surgical History:  Procedure Laterality Date  . CATARACT EXTRACTION W/ INTRAOCULAR LENS IMPLANT     HPI:  76 year old male admitted from SNF with left facial droop, left arm/leg weakness, severely altered mentation, severe aphasia and dysarthria. CT scan shows right thalamus, mid-brain ICH with IVH. PMH includes dementia and ETOH.    Assessment / Plan / Recommendation Clinical Impression  Swallow evaluation complete with limitations given worsening in lethargy as study progressed. Vocal quality intermittently wet at baseline suggestive of penetration and/or aspiration of secretions. Severe left sided facial strength noted, likely impacting swallowing function. Oral care and ice chips provided with limited oral response, requiring eventual suctioning of bolus from oral cavity. Recommend continued NPO. Will f/u at bedside for diagnostic po trials when patient more alert.  SLP Visit Diagnosis: Cognitive communication deficit (R41.841);Dysphagia, oropharyngeal phase (R13.12)    Aspiration Risk  Severe aspiration risk    Diet Recommendation NPO   Medication Administration: Via alternative means    Other  Recommendations     Follow up Recommendations Skilled Nursing facility      Frequency and Duration min 2x/week  2 weeks       Prognosis Prognosis for Safe Diet Advancement: Fair Barriers to  Reach Goals: Severity of deficits      Swallow Study   General HPI: 76 year old male admitted from SNF with left facial droop, left arm/leg weakness, severely altered mentation, severe aphasia and dysarthria. CT scan shows right thalamus, mid-brain ICH with IVH. PMH includes dementia and ETOH.  Type of Study: Bedside Swallow Evaluation Previous Swallow Assessment: none noted Diet Prior to this Study: NPO Temperature Spikes Noted: No Respiratory Status: Room air History of Recent Intubation: No Behavior/Cognition: Alert;Lethargic/Drowsy Oral Cavity Assessment: Within Functional Limits Oral Care Completed by SLP: Yes Self-Feeding Abilities: Total assist Patient Positioning: Upright in bed Baseline Vocal Quality: Low vocal intensity;Wet Volitional Cough: Cognitively unable to elicit Volitional Swallow: Unable to elicit    Oral/Motor/Sensory Function Overall Oral Motor/Sensory Function: Severe impairment Facial ROM: Reduced right;Suspected CN VII (facial) dysfunction Facial Symmetry: Abnormal symmetry right;Suspected CN VII (facial) dysfunction Facial Strength: Reduced left;Suspected CN VII (facial) dysfunction Lingual ROM: Reduced left;Suspected CN XII (hypoglossal) dysfunction Lingual Symmetry: Abnormal symmetry left;Suspected CN XII (hypoglossal) dysfunction Lingual Strength: Reduced;Suspected CN XII (hypoglossal) dysfunction Mandible: Impaired   Ice Chips Ice chips: Impaired Presentation: Spoon Oral Phase Impairments: Poor awareness of bolus;Reduced lingual movement/coordination   Thin Liquid Thin Liquid: Not tested    Nectar Thick Nectar Thick Liquid: Not tested   Honey Thick Honey Thick Liquid: Not tested   Puree Puree: Not tested   Solid   Iceis Knab MA, CCC-SLP [phone]    Solid: Not tested        Antonio Flynn 04/03/2017,3:20 PM

## 2017-04-03 NOTE — Progress Notes (Signed)
STROKE TEAM PROGRESS NOTE   SUBJECTIVE (INTERVAL HISTORY) No family is at the bedside.  Overall his condition is gradually improving. He is more awake alert and eyes spontaneous opening, able to tell me his name and age but disorientated to place. Not able to repeat or name. LUE weaker than right, but moving b/l legs symmetrically on pain stimulation. BP stable on cleviprex   OBJECTIVE Temp:  [98.7 F (37.1 C)-101 F (38.3 C)] 101 F (38.3 C) (12/31 0800) Pulse Rate:  [62-105] 102 (12/31 0800) Cardiac Rhythm: Normal sinus rhythm (12/31 0400) Resp:  [14-35] 32 (12/31 0800) BP: (114-166)/(71-105) 128/88 (12/31 0800) SpO2:  [94 %-100 %] 95 % (12/31 0800) Weight:  [145 lb 15.1 oz (66.2 kg)-154 lb 15.7 oz (70.3 kg)] 145 lb 15.1 oz (66.2 kg) (12/30 1638)  Recent Labs  Lab 04/02/17 1507 04/02/17 1729 04/02/17 2201 04/03/17 0853  GLUCAP 86 112* 118* 102*   Recent Labs  Lab 04/02/17 1502 04/02/17 1523 04/03/17 0722  NA 140 143 145  K 4.1 4.2 3.6  CL 110 106 113*  CO2 25  --  23  GLUCOSE 94 94 111*  BUN 7 8 6   CREATININE 1.22 1.20 1.20  CALCIUM 9.2  --  9.1   Recent Labs  Lab 04/02/17 1502  AST 16  ALT 13*  ALKPHOS 61  BILITOT 1.2  PROT 6.4*  ALBUMIN 3.5   Recent Labs  Lab 04/02/17 1502 04/02/17 1523 04/03/17 0722  WBC 6.4  --  7.9  NEUTROABS 4.0  --   --   HGB 14.1 14.3 14.3  HCT 39.9 42.0 41.7  MCV 86.0  --  86.5  PLT 140*  --  153   No results for input(s): CKTOTAL, CKMB, CKMBINDEX, TROPONINI in the last 168 hours. Recent Labs    04/02/17 1502  LABPROT 13.9  INR 1.08   Recent Labs    04/02/17 1540  COLORURINE YELLOW  LABSPEC 1.008  PHURINE 7.0  GLUCOSEU NEGATIVE  HGBUR NEGATIVE  BILIRUBINUR NEGATIVE  KETONESUR NEGATIVE  PROTEINUR NEGATIVE  NITRITE NEGATIVE  LEUKOCYTESUR NEGATIVE       Component Value Date/Time   CHOL 121 04/03/2017 0722   TRIG 49 04/03/2017 0722   HDL 45 04/03/2017 0722   CHOLHDL 2.7 04/03/2017 0722   VLDL 10  04/03/2017 0722   LDLCALC 66 04/03/2017 0722   Lab Results  Component Value Date   HGBA1C 6.0 (H) 04/03/2017      Component Value Date/Time   LABOPIA NONE DETECTED 04/02/2017 1540   COCAINSCRNUR NONE DETECTED 04/02/2017 1540   LABBENZ POSITIVE (A) 04/02/2017 1540   AMPHETMU NONE DETECTED 04/02/2017 1540   THCU NONE DETECTED 04/02/2017 1540   LABBARB NONE DETECTED 04/02/2017 1540    Recent Labs  Lab 04/02/17 1502  ETH <10    I have personally reviewed the radiological images below and agree with the radiology interpretations.  Ct Angio Head and neck W Or Wo Contrast 04/02/2017 IMPRESSION: No intracranial aneurysm or vascular malformation to account for the hemorrhage in the right thalamus and midbrain. Negative for emergent large vessel occlusion. 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 No significant carotid or vertebral artery stenosis in the neck.   Ct Head Wo Contrast 04/02/2017 IMPRESSION: 1. Stable acute hemorrhage of right thalamus extending into the ventricular system. Stable ventricle size. 2. No interval acute intracranial hemorrhage, infarct, or mass effect. 3. Stable background of chronic microvascular ischemic changes and parenchymal volume loss of the brain.  Ct Head Code Stroke Wo Contrast 04/02/2017 IMPRESSION: 1. Acute hemorrhage in the midbrain with extension at the ventricles. Negative for hydrocephalus. This hemorrhage is most likely due to hypertension, vascular malformation possible 2. Atrophy and moderate chronic microvascular ischemia. 3. Complete pneumatization of the clivus with an unusual appearance. Question postsurgical defect in the posterior wall of the sphenoid sinus leading to pneumatization of the clivus.   TTE pending   PHYSICAL EXAM  Temp:  [98.7 F (37.1 C)-101 F (38.3 C)] 101 F (38.3 C) (12/31 0800) Pulse Rate:  [62-105] 102 (12/31 0800) Resp:  [14-35] 32 (12/31 0800) BP: (114-166)/(71-105)  128/88 (12/31 0800) SpO2:  [94 %-100 %] 95 % (12/31 0800) Weight:  [145 lb 15.1 oz (66.2 kg)-154 lb 15.7 oz (70.3 kg)] 145 lb 15.1 oz (66.2 kg) (12/30 1638)  General - Well nourished, well developed, lethargic.  Ophthalmologic - fundi not visualized due to noncooperation.    Cardiovascular - regular rhythm, but tachycardia with HR 103.  Neuro - lethargic but eyes open, awake, orientated to self, age, but not orientated to place. Severe dysarthria, not able to repeat or name. Able to follow limited simple commands, paucity of speech. Pupil left 74mm irregular shape pupil, not reactive to light, right pupil 19mm, not reactive to light (as per sister, bilateral eye surgeries in the past). Not blinking to visual threat on the left. Significant left facial droop, eye mid position, b/l gaze palsy, doll's eye sluggish. Tongue in the middle. Positive corneal and gag. Right UE 4/5 and right LE 3/5. However, LUE drift to bed. BLE 3/5 on pain stimulation. DTR 1+ and not cooperative on babinski. Sensation, coordination not cooperative and gait not tested.   ASSESSMENT/PLAN Mr. Justine Cossin is a 76 y.o. male with history of HTN, HLD, DM, dementia, prostate cancer presented with AMS with left sided weakness and facial droop. CT showed right thalamus and midbrain ICH with IVH, no hydrocephalus. Admitted to ICU.  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   2D Echo  pending  LDL 66  HgbA1c 6.0  SCDs for VTE prophylaxis  Diet NPO time specified   aspirin 325 mg daily prior to admission, now on No antithrombotic due to [place]  Ongoing aggressive stroke risk factor management  Therapy recommendations:  pending  Disposition:  pending  Diabetes  HgbA1c 6.0  goal < 7.0  Controlled  Home meds on levemir  CBG monitoring  SSI  Hypertension Stable BP goal < 160 now as repeat CT showed stable ICH Still on cleviprex low dose Start po BP meds once passed [place]  Long term BP goal normotensive  Hyperlipidemia  Home meds:  lipitor 10   LDL 66, goal < 70  Resume lipitor on discharge  Tobacco abuse  Current smoker  Smoking cessation counseling will be provided  Other Stroke Risk Factors  Advanced age  ETOH use  Other Active Problems  Dementia with behavior disturbance on nemanda and lamictal - resume meds once passed swallow or have po access  Prostate cancer - stable followed by urology  Hospital day # 1  This patient is critically ill due to [place] with IVH, hypertensive emergency, smoker and at significant risk of neurological worsening, death form hematoma expansion, stroke,  seizure, heart failure. This patient's care requires constant monitoring of vital signs, hemodynamics, respiratory and cardiac monitoring, review of multiple databases, neurological assessment, discussion with family, other specialists and medical decision making of high complexity. I spent 40 minutes of neurocritical care time in the care of this patient.  Rosalin Hawking, MD PhD Stroke Neurology 04/03/2017 9:14 AM    To contact Stroke Continuity provider, please refer to [email or web address] After hours, contact General Neurology

## 2017-04-04 ENCOUNTER — Inpatient Hospital Stay (HOSPITAL_COMMUNITY): Payer: Medicare Other

## 2017-04-04 DIAGNOSIS — G911 Obstructive hydrocephalus: Secondary | ICD-10-CM

## 2017-04-04 LAB — CBC
HEMATOCRIT: 42.5 % (ref 39.0–52.0)
HEMOGLOBIN: 14.3 g/dL (ref 13.0–17.0)
MCH: 29.7 pg (ref 26.0–34.0)
MCHC: 33.6 g/dL (ref 30.0–36.0)
MCV: 88.2 fL (ref 78.0–100.0)
Platelets: 140 10*3/uL — ABNORMAL LOW (ref 150–400)
RBC: 4.82 MIL/uL (ref 4.22–5.81)
RDW: 15.2 % (ref 11.5–15.5)
WBC: 8.9 10*3/uL (ref 4.0–10.5)

## 2017-04-04 LAB — GLUCOSE, CAPILLARY
GLUCOSE-CAPILLARY: 109 mg/dL — AB (ref 65–99)
GLUCOSE-CAPILLARY: 83 mg/dL (ref 65–99)
GLUCOSE-CAPILLARY: 89 mg/dL (ref 65–99)
GLUCOSE-CAPILLARY: 123 mg/dL — AB (ref 65–99)
Glucose-Capillary: 115 mg/dL — ABNORMAL HIGH (ref 65–99)

## 2017-04-04 LAB — BASIC METABOLIC PANEL
ANION GAP: 9 (ref 5–15)
BUN: 8 mg/dL (ref 6–20)
CHLORIDE: 114 mmol/L — AB (ref 101–111)
CO2: 23 mmol/L (ref 22–32)
Calcium: 8.9 mg/dL (ref 8.9–10.3)
Creatinine, Ser: 1.25 mg/dL — ABNORMAL HIGH (ref 0.61–1.24)
GFR calc Af Amer: >60 mL/min (ref >60)
GFR, EST NON AFRICAN AMERICAN: 55 mL/min — AB (ref >60)
GLUCOSE: 91 mg/dL (ref 65–99)
POTASSIUM: 3.7 mmol/L (ref 3.5–5.1)
SODIUM: 146 mmol/L — AB (ref 135–145)

## 2017-04-04 LAB — TRIGLYCERIDES: Triglycerides: 47 mg/dL (ref <150)

## 2017-04-04 MED ORDER — METOPROLOL TARTRATE 5 MG/5ML IV SOLN
5.0000 mg | INTRAVENOUS | Status: AC
  Administered 2017-04-04: 5 mg via INTRAVENOUS
  Filled 2017-04-04: qty 5

## 2017-04-04 MED ORDER — FOLIC ACID 1 MG PO TABS
1.0000 mg | ORAL_TABLET | Freq: Every day | ORAL | Status: DC
  Administered 2017-04-04: 1 mg via ORAL
  Filled 2017-04-04: qty 1

## 2017-04-04 MED ORDER — HEPARIN SODIUM (PORCINE) 5000 UNIT/ML IJ SOLN
5000.0000 [IU] | Freq: Three times a day (TID) | INTRAMUSCULAR | Status: DC
  Administered 2017-04-04 – 2017-04-09 (×16): 5000 [IU] via SUBCUTANEOUS
  Filled 2017-04-04 (×17): qty 1

## 2017-04-04 MED ORDER — LAMOTRIGINE 25 MG PO TABS
50.0000 mg | ORAL_TABLET | Freq: Two times a day (BID) | ORAL | Status: DC
  Administered 2017-04-04: 50 mg via ORAL
  Filled 2017-04-04: qty 2

## 2017-04-04 MED ORDER — METOPROLOL TARTRATE 50 MG PO TABS: 100.0000 mg | ORAL_TABLET | ORAL | Status: DC

## 2017-04-04 MED ORDER — METOPROLOL TARTRATE 50 MG PO TABS
50.0000 mg | ORAL_TABLET | Freq: Two times a day (BID) | ORAL | Status: DC
  Administered 2017-04-04 – 2017-04-11 (×14): 50 mg via ORAL
  Filled 2017-04-04 (×16): qty 1

## 2017-04-04 MED ORDER — AMLODIPINE BESYLATE 5 MG PO TABS: 5.0000 mg | ORAL_TABLET | Freq: Every day | ORAL | Status: DC

## 2017-04-04 MED ORDER — VITAMIN B-1 100 MG PO TABS
100.0000 mg | ORAL_TABLET | Freq: Every day | ORAL | Status: DC
  Administered 2017-04-04: 100 mg via ORAL
  Filled 2017-04-04: qty 1

## 2017-04-04 MED ORDER — DONEPEZIL HCL 10 MG PO TABS
10.0000 mg | ORAL_TABLET | Freq: Every day | ORAL | Status: DC
  Administered 2017-04-04: 10 mg via ORAL
  Filled 2017-04-04: qty 1

## 2017-04-04 MED ORDER — AMLODIPINE BESYLATE 10 MG PO TABS
10.0000 mg | ORAL_TABLET | Freq: Every day | ORAL | Status: DC
  Administered 2017-04-04: 10 mg via ORAL
  Filled 2017-04-04: qty 1

## 2017-04-04 MED ORDER — PANTOPRAZOLE SODIUM 40 MG PO TBEC
40.0000 mg | DELAYED_RELEASE_TABLET | Freq: Every day | ORAL | Status: DC
  Administered 2017-04-04: 40 mg via ORAL
  Filled 2017-04-04: qty 1

## 2017-04-04 MED ORDER — LOSARTAN POTASSIUM 50 MG PO TABS
100.0000 mg | ORAL_TABLET | Freq: Every day | ORAL | Status: DC
  Administered 2017-04-04: 100 mg via ORAL
  Filled 2017-04-04: qty 2

## 2017-04-04 MED ORDER — VANCOMYCIN HCL 10 G IV SOLR
1250.0000 mg | INTRAVENOUS | Status: DC
  Administered 2017-04-05 – 2017-04-07 (×3): 1250 mg via INTRAVENOUS
  Filled 2017-04-04 (×3): qty 1250

## 2017-04-04 MED ORDER — MEMANTINE HCL ER 7 MG PO CP24
21.0000 mg | ORAL_CAPSULE | Freq: Every day | ORAL | Status: DC
  Administered 2017-04-04 – 2017-04-05 (×2): 21 mg via ORAL
  Filled 2017-04-04 (×4): qty 3

## 2017-04-04 MED ORDER — MIRABEGRON ER 50 MG PO TB24
50.0000 mg | ORAL_TABLET | ORAL | Status: DC
  Administered 2017-04-04 – 2017-04-08 (×3): 50 mg via ORAL
  Filled 2017-04-04 (×2): qty 2
  Filled 2017-04-04 (×7): qty 1

## 2017-04-04 MED ORDER — DONEPEZIL HCL 10 MG PO TABS: 5.0000 mg | ORAL_TABLET | Freq: Every day | ORAL | Status: DC

## 2017-04-04 MED ORDER — INSULIN ASPART 100 UNIT/ML ~~LOC~~ SOLN
0.0000 [IU] | Freq: Four times a day (QID) | SUBCUTANEOUS | Status: DC
  Administered 2017-04-05: 1 [IU] via SUBCUTANEOUS

## 2017-04-04 MED ORDER — TAMSULOSIN HCL 0.4 MG PO CAPS
0.4000 mg | ORAL_CAPSULE | Freq: Two times a day (BID) | ORAL | Status: DC
  Administered 2017-04-04 – 2017-04-11 (×15): 0.4 mg via ORAL
  Filled 2017-04-04 (×16): qty 1

## 2017-04-04 MED ORDER — BENZTROPINE MESYLATE 1 MG PO TABS
0.5000 mg | ORAL_TABLET | Freq: Two times a day (BID) | ORAL | Status: DC
  Administered 2017-04-04 (×2): 0.5 mg via ORAL
  Filled 2017-04-04 (×2): qty 1

## 2017-04-04 MED ORDER — PROSIGHT PO TABS
1.0000 | ORAL_TABLET | Freq: Every day | ORAL | Status: DC
  Administered 2017-04-04 – 2017-04-11 (×8): 1 via ORAL
  Filled 2017-04-04 (×9): qty 1

## 2017-04-04 MED ORDER — VANCOMYCIN HCL 10 G IV SOLR
1250.0000 mg | Freq: Once | INTRAVENOUS | Status: AC
  Administered 2017-04-04: 1250 mg via INTRAVENOUS
  Filled 2017-04-04: qty 1250

## 2017-04-04 MED ORDER — INSULIN ASPART 100 UNIT/ML ~~LOC~~ SOLN: 0.0000 [IU] | Freq: Four times a day (QID) | SUBCUTANEOUS | Status: DC

## 2017-04-04 NOTE — Progress Notes (Signed)
  Speech Language Pathology Treatment: Dysphagia  Patient Details Name: Antonio Flynn MRN: [id number] DOB: Apr 09, 1940 Today's Date: 04/04/2017 Time: 8676-1950 SLP Time Calculation (min) (ACUTE ONLY): 12 min  Assessment / Plan / Recommendation Clinical Impression  Pt now with IVC drain, reportedly still quickly fatigues per RN. Pt sustained arousal for over 10 minutes, eyes opening following one step oral motor commands but only intermittently responding verbally to questions. Able to verbalize orientation to self, place and age with MD. Pt responded appropriately to visual cues with PO, took 4 oz of applesauce with total assist feeding with normal oral function and timely swallow with no signs of aspiration. Also took sips of water without difficulty. Vocal quality consistently dry. In order to avoid large bore feeding tube today, recommend pt consume meds in puree. If arousal remains consistent over the next 24 hours, will consider diet initiation tomorrow.   HPI HPI: 77 year old male admitted from SNF with left facial droop, left arm/leg weakness, severely altered mentation, severe aphasia and dysarthria. CT scan shows right thalamus, mid-brain ICH with IVH. PMH includes dementia and ETOH.       SLP Plan  Continue with current plan of care       Recommendations  Medication Administration: Whole meds with puree Supervision: Full supervision/cueing for compensatory strategies                Oral Care Recommendations: Oral care QID Follow up Recommendations: Skilled Nursing facility SLP Visit Diagnosis: Cognitive communication deficit (R41.841);Dysphagia, oropharyngeal phase (R13.12) Plan: Continue with current plan of care       [place] Danyiel Crespin, MA CCC-SLP [phone]  Lynann Beaver 04/04/2017, 10:21 AM

## 2017-04-04 NOTE — Progress Notes (Signed)
Patient ID: Antonio Flynn, male   DOB: 1940/09/10, 77 y.o.   MRN: [id number] BP 125/87   Pulse 99   Temp 98.1 F (36.7 C) (Axillary)   Resp (!) 26   Ht 5\' 8"  (1.727 m)   Wt 66.2 kg (145 lb 15.1 oz)   SpO2 97%   BMI 22.19 kg/m  Antonio Flynn is lethargic, responsive to voice, knows his name, following some commands Ventricular catheter drainage is minimal, the fluid column is pulsatile.  Repeat head CT is unchanged to my eye. Pressure remains quite low.

## 2017-04-04 NOTE — Progress Notes (Signed)
Pharmacy Antibiotic Note  Antonio Flynn is a 77 y.o. male admitted on 04/02/2017 with right thalamus, midbrain ICH with IVH, now s/p ventricular catheter.  Pharmacy has been consulted for vancomycin prophylaxis dosing.  Plan: Vancomycin 1250mg  IV every 24 hours.  Goal trough 10-20 mcg/mL.  Height: 5\' 8"  (172.7 cm) Weight: 145 lb 15.1 oz (66.2 kg) IBW/kg (Calculated) : 68.4  Temp (24hrs), Avg:99.8 F (37.7 C), Min:98.7 F (37.1 C), Max:101 F (38.3 C)  Recent Labs  Lab 04/02/17 1502 04/02/17 1523 04/03/17 0722  WBC 6.4  --  7.9  CREATININE 1.22 1.20 1.20    Estimated Creatinine Clearance: 49.8 mL/min (by C-G formula based on SCr of 1.2 mg/dL).    Allergies  Allergen Reactions  . Penicillins      Thank you for allowing pharmacy to be a part of this patient's care.  Wynona Neat, PharmD, BCPS  04/04/2017 1:20 AM

## 2017-04-04 NOTE — Progress Notes (Signed)
PT Cancellation Note  Patient Details Name: Antonio Flynn MRN: [id number] DOB: Feb 02, 1941   Cancelled Treatment:    Reason Eval/Treat Not Completed: Medical issues which prohibited therapy(pt on bedrest with ventriculostomy placed. )   Letzy Gullickson B Fumiko Cham 04/04/2017, 7:08 AM  Elwyn Reach, [place]

## 2017-04-04 NOTE — Progress Notes (Signed)
MD Lindzen notified patient became tachycardic, tachypneic, and hypertensive and required restart of clevidipine. While continuously monitoring pt became non-verbal and would no longer answer RN questions, MD paged again regarding neuro changes and STAT CT performed, ventric drain ultimately placed.

## 2017-04-04 NOTE — Consult Note (Signed)
Reason for Consult:Hydrocephalus, acute Referring Physician: Gryphon, Vanderveen is an 77 y.o. male.  HPI: admitted for an intracranial hemorrhage causing hydrocephalus. I am asked to place catheter per the neurology stroke service.   Past Medical History:  Diagnosis Date  . Anxiety   . BPH (benign prostatic hyperplasia)   . Constipation   . Dementia    may have had a stroke  . Depression   . Diabetes mellitus type I ([place])   . Hyperlipidemia   . Hypertension   . Hypocalcemia   . Vitamin D deficiency     Past Surgical History:  Procedure Laterality Date  . CATARACT EXTRACTION W/ INTRAOCULAR LENS IMPLANT      Family History  Problem Relation Age of Onset  . Hypertension Sister   . Hypertension Brother   . Hypertension Sister     Social History:  reports that he has been smoking cigarettes.  He has a 16.50 pack-year smoking history. he has never used smokeless tobacco. He reports that he does not drink alcohol or use drugs.  Allergies:  Allergies  Allergen Reactions  . Penicillins     Medications: I have reviewed the patient's current medications.  Results for orders placed or performed during the hospital encounter of 04/02/17 (from the past 48 hour(s))  Ethanol     Status: None   Collection Time: 04/02/17  3:02 PM  Result Value Ref Range   Alcohol, Ethyl (B) <10 <10 mg/dL    Comment:        LOWEST DETECTABLE LIMIT FOR SERUM ALCOHOL IS 10 mg/dL FOR MEDICAL PURPOSES ONLY   Protime-INR     Status: None   Collection Time: 04/02/17  3:02 PM  Result Value Ref Range   Prothrombin Time 13.9 11.4 - 15.2 seconds   INR 1.08   APTT     Status: None   Collection Time: 04/02/17  3:02 PM  Result Value Ref Range   aPTT 31 24 - 36 seconds  CBC     Status: Abnormal   Collection Time: 04/02/17  3:02 PM  Result Value Ref Range   WBC 6.4 4.0 - 10.5 K/uL   RBC 4.64 4.22 - 5.81 MIL/uL   Hemoglobin 14.1 13.0 - 17.0 g/dL   HCT 39.9 39.0 - 52.0 %   MCV 86.0 78.0 -  100.0 fL   MCH 30.4 26.0 - 34.0 pg   MCHC 35.3 30.0 - 36.0 g/dL   RDW 14.9 11.5 - 15.5 %   Platelets 140 (L) 150 - 400 K/uL  Differential     Status: None   Collection Time: 04/02/17  3:02 PM  Result Value Ref Range   Neutrophils Relative % 61 %   Neutro Abs 4.0 1.7 - 7.7 K/uL   Lymphocytes Relative 25 %   Lymphs Abs 1.6 0.7 - 4.0 K/uL   Monocytes Relative 6 %   Monocytes Absolute 0.4 0.1 - 1.0 K/uL   Eosinophils Relative 7 %   Eosinophils Absolute 0.4 0.0 - 0.7 K/uL   Basophils Relative 1 %   Basophils Absolute 0.0 0.0 - 0.1 K/uL  Comprehensive metabolic panel     Status: Abnormal   Collection Time: 04/02/17  3:02 PM  Result Value Ref Range   Sodium 140 135 - 145 mmol/L   Potassium 4.1 3.5 - 5.1 mmol/L   Chloride 110 101 - 111 mmol/L   CO2 25 22 - 32 mmol/L   Glucose, Bld 94 65 - 99 mg/dL  BUN 7 6 - 20 mg/dL   Creatinine, Ser 1.22 0.61 - 1.24 mg/dL   Calcium 9.2 8.9 - 10.3 mg/dL   Total Protein 6.4 (L) 6.5 - 8.1 g/dL   Albumin 3.5 3.5 - 5.0 g/dL   AST 16 15 - 41 U/L   ALT 13 (L) 17 - 63 U/L   Alkaline Phosphatase 61 38 - 126 U/L   Total Bilirubin 1.2 0.3 - 1.2 mg/dL   GFR calc non Af Amer 56 (L) >60 mL/min   GFR calc Af Amer >60 >60 mL/min    Comment: (NOTE) The eGFR has been calculated using the CKD EPI equation. This calculation has not been validated in all clinical situations. eGFR's persistently <60 mL/min signify possible Chronic Kidney Disease.    Anion gap 5 5 - 15  CBG monitoring, ED     Status: None   Collection Time: 04/02/17  3:07 PM  Result Value Ref Range   Glucose-Capillary 86 65 - 99 mg/dL  POCT i-Stat troponin I     Status: None   Collection Time: 04/02/17  3:21 PM  Result Value Ref Range   Troponin i, poc 0.00 0.00 - 0.08 ng/mL   Comment 3            Comment: Due to the release kinetics of cTnI, a negative result within the first hours of the onset of symptoms does not rule out myocardial infarction with certainty. If myocardial infarction  is still suspected, repeat the test at appropriate intervals.   I-Stat Chem 8, ED     Status: None   Collection Time: 04/02/17  3:23 PM  Result Value Ref Range   Sodium 143 135 - 145 mmol/L   Potassium 4.2 3.5 - 5.1 mmol/L   Chloride 106 101 - 111 mmol/L   BUN 8 6 - 20 mg/dL   Creatinine, Ser 1.20 0.61 - 1.24 mg/dL   Glucose, Bld 94 65 - 99 mg/dL   Calcium, Ion 1.17 1.15 - 1.40 mmol/L   TCO2 25 22 - 32 mmol/L   Hemoglobin 14.3 13.0 - 17.0 g/dL   HCT 42.0 39.0 - 52.0 %  Urine rapid drug screen (hosp performed)     Status: Abnormal   Collection Time: 04/02/17  3:40 PM  Result Value Ref Range   Opiates NONE DETECTED NONE DETECTED   Cocaine NONE DETECTED NONE DETECTED   Benzodiazepines POSITIVE (A) NONE DETECTED   Amphetamines NONE DETECTED NONE DETECTED   Tetrahydrocannabinol NONE DETECTED NONE DETECTED   Barbiturates NONE DETECTED NONE DETECTED    Comment: (NOTE) DRUG SCREEN FOR MEDICAL PURPOSES ONLY.  IF CONFIRMATION IS NEEDED FOR ANY PURPOSE, NOTIFY LAB WITHIN 5 DAYS. LOWEST DETECTABLE LIMITS FOR URINE DRUG SCREEN Drug Class                     Cutoff (ng/mL) Amphetamine and metabolites    1000 Barbiturate and metabolites    200 Benzodiazepine                 622 Tricyclics and metabolites     300 Opiates and metabolites        300 Cocaine and metabolites        300 THC                            50   Urinalysis, Routine w reflex microscopic     Status: None   Collection Time: 04/02/17  3:40 PM  Result Value Ref Range   Color, Urine YELLOW YELLOW   APPearance CLEAR CLEAR   Specific Gravity, Urine 1.008 1.005 - 1.030   pH 7.0 5.0 - 8.0   Glucose, UA NEGATIVE NEGATIVE mg/dL   Hgb urine dipstick NEGATIVE NEGATIVE   Bilirubin Urine NEGATIVE NEGATIVE   Ketones, ur NEGATIVE NEGATIVE mg/dL   Protein, ur NEGATIVE NEGATIVE mg/dL   Nitrite NEGATIVE NEGATIVE   Leukocytes, UA NEGATIVE NEGATIVE  MRSA PCR Screening     Status: None   Collection Time: 04/02/17  4:55 PM   Result Value Ref Range   MRSA by PCR NEGATIVE NEGATIVE    Comment:        The GeneXpert MRSA Assay (FDA approved for NASAL specimens only), is one component of a comprehensive MRSA colonization surveillance program. It is not intended to diagnose MRSA infection nor to guide or monitor treatment for MRSA infections.   Glucose, capillary     Status: Abnormal   Collection Time: 04/02/17  5:29 PM  Result Value Ref Range   Glucose-Capillary 112 (H) 65 - 99 mg/dL  TSH     Status: None   Collection Time: 04/02/17  6:59 PM  Result Value Ref Range   TSH 0.591 0.350 - 4.500 uIU/mL    Comment: Performed by a 3rd Generation assay with a functional sensitivity of <=0.01 uIU/mL.  Glucose, capillary     Status: Abnormal   Collection Time: 04/02/17 10:01 PM  Result Value Ref Range   Glucose-Capillary 118 (H) 65 - 99 mg/dL  CBC     Status: None   Collection Time: 04/03/17  7:22 AM  Result Value Ref Range   WBC 7.9 4.0 - 10.5 K/uL   RBC 4.82 4.22 - 5.81 MIL/uL   Hemoglobin 14.3 13.0 - 17.0 g/dL   HCT 41.7 39.0 - 52.0 %   MCV 86.5 78.0 - 100.0 fL   MCH 29.7 26.0 - 34.0 pg   MCHC 34.3 30.0 - 36.0 g/dL   RDW 14.9 11.5 - 15.5 %   Platelets 153 150 - 400 K/uL  Basic metabolic panel     Status: Abnormal   Collection Time: 04/03/17  7:22 AM  Result Value Ref Range   Sodium 145 135 - 145 mmol/L   Potassium 3.6 3.5 - 5.1 mmol/L   Chloride 113 (H) 101 - 111 mmol/L   CO2 23 22 - 32 mmol/L   Glucose, Bld 111 (H) 65 - 99 mg/dL   BUN 6 6 - 20 mg/dL   Creatinine, Ser 1.20 0.61 - 1.24 mg/dL   Calcium 9.1 8.9 - 10.3 mg/dL   GFR calc non Af Amer 57 (L) >60 mL/min   GFR calc Af Amer >60 >60 mL/min    Comment: (NOTE) The eGFR has been calculated using the CKD EPI equation. This calculation has not been validated in all clinical situations. eGFR's persistently <60 mL/min signify possible Chronic Kidney Disease.    Anion gap 9 5 - 15  Hemoglobin A1c     Status: Abnormal   Collection Time:  04/03/17  7:22 AM  Result Value Ref Range   Hgb A1c MFr Bld 6.0 (H) 4.8 - 5.6 %    Comment: (NOTE) Pre diabetes:          5.7%-6.4% Diabetes:              >6.4% Glycemic control for   <7.0% adults with diabetes    Mean Plasma Glucose 125.5 mg/dL  Lipid panel  Status: None   Collection Time: 04/03/17  7:22 AM  Result Value Ref Range   Cholesterol 121 0 - 200 mg/dL   Triglycerides 49 <150 mg/dL   HDL 45 >40 mg/dL   Total CHOL/HDL Ratio 2.7 RATIO   VLDL 10 0 - 40 mg/dL   LDL Cholesterol 66 0 - 99 mg/dL    Comment:        Total Cholesterol/HDL:CHD Risk Coronary Heart Disease Risk Table                     Men   Women  1/2 Average Risk   3.4   3.3  Average Risk       5.0   4.4  2 X Average Risk   9.6   7.1  3 X Average Risk  23.4   11.0        Use the calculated Patient Ratio above and the CHD Risk Table to determine the patient's CHD Risk.        ATP III CLASSIFICATION (LDL):  <100     mg/dL   Optimal  100-129  mg/dL   Near or Above                    Optimal  130-159  mg/dL   Borderline  160-189  mg/dL   High  >190     mg/dL   Very High   Vitamin B12     Status: None   Collection Time: 04/03/17  7:22 AM  Result Value Ref Range   Vitamin B-12 256 180 - 914 pg/mL    Comment: (NOTE) This assay is not validated for testing neonatal or myeloproliferative syndrome specimens for Vitamin B12 levels.   Glucose, capillary     Status: Abnormal   Collection Time: 04/03/17  8:53 AM  Result Value Ref Range   Glucose-Capillary 102 (H) 65 - 99 mg/dL  Glucose, capillary     Status: None   Collection Time: 04/03/17 12:18 PM  Result Value Ref Range   Glucose-Capillary 88 65 - 99 mg/dL  Glucose, capillary     Status: None   Collection Time: 04/03/17  4:46 PM  Result Value Ref Range   Glucose-Capillary 92 65 - 99 mg/dL   Comment 1 Notify RN    Comment 2 Document in Chart   Glucose, capillary     Status: None   Collection Time: 04/03/17  7:56 PM  Result Value Ref Range    Glucose-Capillary 98 65 - 99 mg/dL    Ct Angio Head W Or Wo Contrast  Result Date: 04/02/2017 CLINICAL DATA:  Stroke.  Intracranial hemorrhage EXAM: CT HEAD WITHOUT CONTRAST CT ANGIOGRAPHY OF THE HEAD AND NECK TECHNIQUE: Contiguous axial images were obtained from the base of the skull through the vertex without intravenous contrast. Multidetector CT imaging of the head and neck was performed using the standard protocol during bolus administration of intravenous contrast. Multiplanar CT image reconstructions and MIPs were obtained to evaluate the vascular anatomy. Carotid stenosis measurements (when applicable) are obtained utilizing NASCET criteria, using the distal internal carotid diameter as the denominator. CONTRAST:  30m ISOVUE-370 IOPAMIDOL (ISOVUE-370) INJECTION 76%<Contrast>583mISOVUE-370 IOPAMIDOL (ISOVUE-370) INJECTION 76% COMPARISON:  CT head 04/02/2017 FINDINGS: CTA NECK Aortic arch: Minimal atherosclerotic disease aortic arch Right carotid system: Right common carotid artery widely patent. Minimal atherosclerotic disease right carotid bifurcation without stenosis Left carotid system: Left common carotid artery widely patent and tortuous. Left carotid bifurcation patent without stenosis.  Mild atherosclerotic disease at the bifurcation Vertebral arteries:Left vertebral artery is dominant and widely patent to the basilar. Small right vertebral artery with minimal basilar contribution. No significant vertebral artery stenosis bilaterally. Skeleton: ACDF C4 through C6.  No acute skeletal abnormality. Other neck: Negative CTA HEAD Anterior circulation: Atherosclerotic calcification cavernous carotid bilaterally with mild stenosis bilaterally. Anterior and middle cerebral artery patent bilaterally without stenosis or large vessel occlusion. No vascular malformation. Posterior circulation: Left vertebral artery dominant. Small right vertebral artery with minimal contribution to the basilar. Basilar  widely patent. PICA, AICA, superior cerebellar, and posterior cerebral artery patent bilaterally. Fetal origin right posterior cerebral artery. Negative for vascular malformation in the posterior circulation. Venous sinuses: Patent Anatomic variants: None Delayed phase: Not performed IMPRESSION: No intracranial aneurysm or vascular malformation to account for the hemorrhage in the right thalamus and midbrain. Negative for emergent large vessel occlusion. 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 No significant carotid or vertebral artery stenosis in the neck. Electronically Signed   By: Franchot Gallo M.D.   On: 04/02/2017 15:51   Ct Head Wo Contrast  Result Date: 04/03/2017 CLINICAL DATA:  77 y/o M; intracranial hemorrhage. Tachycardia and a aphasia in p.m. EXAM: CT HEAD WITHOUT CONTRAST TECHNIQUE: Contiguous axial images were obtained from the base of the skull through the vertex without intravenous contrast. COMPARISON:  CT head studies dated 04/02/2017. FINDINGS: Brain: Stable hematoma centered in right thalamus and third ventricle the small volume of blood product in the lateral ventricles. Interval increase in third and lateral ventriculomegaly. No new hemorrhage, large infarct, or focal mass effect identified. Stable background of chronic microvascular ischemic changes and parenchymal volume loss of the brain. Vascular: Calcific atherosclerosis of carotid siphons. Skull: Normal. Negative for fracture or focal lesion. Sinuses/Orbits: Mild diffuse paranasal sinus mucosal thickening. Normal aeration of mastoid air cells. Bilateral intra-ocular lens replacement. Other: None. IMPRESSION: 1. Increase lateral and third ventricle size compatible with developing hydrocephalus. No downward herniation. 2. Stable hemorrhage centered in right thalamus and third ventricle with small volume and lateral ventricles. 3. No new acute hemorrhage, infarct, or focal mass effect  identified. 4. Stable chronic microvascular ischemic changes and parenchymal volume loss of the brain. These results were called by telephone at the time of interpretation on 04/03/2017 at 10:33 pm to Dr. Kerney Elbe , who verbally acknowledged these results. Electronically Signed   By: Kristine Garbe M.D.   On: 04/03/2017 22:34   Ct Head Wo Contrast  Result Date: 04/02/2017 CLINICAL DATA:  77 y/o  M; intracranial hemorrhage for follow-up. EXAM: CT HEAD WITHOUT CONTRAST TECHNIQUE: Contiguous axial images were obtained from the base of the skull through the vertex without intravenous contrast. COMPARISON:  04/02/2017 CT head FINDINGS: Brain: Acute intracranial hemorrhage of right thalamus extending into the ventricular system. Stable ventricle size. No new hemorrhage, infarct, or mass effect. No midline shift or effacement of basilar cisterns. Stable chronic microvascular ischemic changes and parenchymal volume loss of the brain. Vascular: Calcific atherosclerosis of carotid siphons. Skull: Normal. Negative for fracture or focal lesion. Sinuses/Orbits: No acute finding.  Clivus pneumatization. Other: None. IMPRESSION: 1. Stable acute hemorrhage of right thalamus extending into the ventricular system. Stable ventricle size. 2. No interval acute intracranial hemorrhage, infarct, or mass effect. 3. Stable background of chronic microvascular ischemic changes and parenchymal volume loss of the brain. Electronically Signed   By: Kristine Garbe M.D.   On: 04/02/2017 23:09   Ct Angio Neck W Or Wo Contrast  Result Date: 04/02/2017 CLINICAL DATA:  Stroke.  Intracranial hemorrhage EXAM: CT HEAD WITHOUT CONTRAST CT ANGIOGRAPHY OF THE HEAD AND NECK TECHNIQUE: Contiguous axial images were obtained from the base of the skull through the vertex without intravenous contrast. Multidetector CT imaging of the head and neck was performed using the standard protocol during bolus administration of intravenous  contrast. Multiplanar CT image reconstructions and MIPs were obtained to evaluate the vascular anatomy. Carotid stenosis measurements (when applicable) are obtained utilizing NASCET criteria, using the distal internal carotid diameter as the denominator. CONTRAST:  94m ISOVUE-370 IOPAMIDOL (ISOVUE-370) INJECTION 76%<Contrast>560mISOVUE-370 IOPAMIDOL (ISOVUE-370) INJECTION 76% COMPARISON:  CT head 04/02/2017 FINDINGS: CTA NECK Aortic arch: Minimal atherosclerotic disease aortic arch Right carotid system: Right common carotid artery widely patent. Minimal atherosclerotic disease right carotid bifurcation without stenosis Left carotid system: Left common carotid artery widely patent and tortuous. Left carotid bifurcation patent without stenosis. Mild atherosclerotic disease at the bifurcation Vertebral arteries:Left vertebral artery is dominant and widely patent to the basilar. Small right vertebral artery with minimal basilar contribution. No significant vertebral artery stenosis bilaterally. Skeleton: ACDF C4 through C6.  No acute skeletal abnormality. Other neck: Negative CTA HEAD Anterior circulation: Atherosclerotic calcification cavernous carotid bilaterally with mild stenosis bilaterally. Anterior and middle cerebral artery patent bilaterally without stenosis or large vessel occlusion. No vascular malformation. Posterior circulation: Left vertebral artery dominant. Small right vertebral artery with minimal contribution to the basilar. Basilar widely patent. PICA, AICA, superior cerebellar, and posterior cerebral artery patent bilaterally. Fetal origin right posterior cerebral artery. Negative for vascular malformation in the posterior circulation. Venous sinuses: Patent Anatomic variants: None Delayed phase: Not performed IMPRESSION: No intracranial aneurysm or vascular malformation to account for the hemorrhage in the right thalamus and midbrain. Negative for emergent large vessel occlusion. Atherosclerotic  disease and mild stenosis in the cavernous carotid bilaterally. No significant carotid or vertebral artery stenosis in the neck. Electronically Signed   By: ChFranchot Gallo.D.   On: 04/02/2017 15:51   Ct Head Code Stroke Wo Contrast  Addendum Date: 04/02/2017   ADDENDUM REPORT: 04/02/2017 15:43 ADDENDUM: These results were called by telephone at the time of interpretation on 04/02/2017 at 3:43 pm to Dr. XuErlinda Hongwho verbally acknowledged these results. Electronically Signed   By: ChFranchot Gallo.D.   On: 04/02/2017 15:43   Result Date: 04/02/2017 CLINICAL DATA:  Code stroke. Focal neuro deficit less than 6 hours, suspect stroke EXAM: CT HEAD WITHOUT CONTRAST TECHNIQUE: Contiguous axial images were obtained from the base of the skull through the vertex without intravenous contrast. COMPARISON:  None. FINDINGS: Brain: Acute hemorrhage in the midbrain measuring 15 mm with extension into the ventricles. Blood in the third and lateral ventricles without hydrocephalus. Generalized atrophy. Chronic microvascular ischemic change with hypodensity throughout the cerebral white matter bilaterally. No acute ischemic infarct identified. No mass lesion. No midline shift. Vascular: Negative for hyperdense vessel Skull: No acute abnormality Sinuses/Orbits: Mild mucosal edema maxillary sinus bilaterally. Bilateral lens replacement. Note is made of complete pneumatization of the clivus with marked thinning of the surrounding bone. The posterior wall of the clivus is not visualized. There is communication with the sphenoid sinus. Question prior sinus surgery. No evidence of mass or enlargement of the sella. Other: None IMPRESSION: 1. Acute hemorrhage in the midbrain with extension at the ventricles. Negative for hydrocephalus. This hemorrhage is most likely due to hypertension, vascular malformation possible 2. Atrophy and moderate chronic microvascular ischemia. 3. Complete pneumatization of the clivus with an unusual  appearance. Question postsurgical defect in the posterior wall of the sphenoid sinus leading to pneumatization of the clivus. Electronically Signed: By: Franchot Gallo M.D. On: 04/02/2017 15:35    Review of Systems  Unable to perform ROS: Mental acuity   Blood pressure (!) 133/91, pulse (!) 121, temperature 100.1 F (37.8 C), temperature source Axillary, resp. rate (!) 34, height 5' 8"  (1.727 m), weight 66.2 kg (145 lb 15.1 oz), SpO2 93 %. Physical Exam  Constitutional: He appears well-developed and well-nourished. He appears lethargic.  HENT:  Head: Normocephalic and atraumatic.  Neck: Neck supple.  Cardiovascular: Regular rhythm.  Respiratory: Effort normal.  GI: Soft.  Neurological: He appears lethargic. He displays no Babinski's sign on the right side. He displays no Babinski's sign on the left side.  Unable to assess sensory or motor systems  Skin: Skin is warm and dry.  pupils unchanged 20m r, 381mleft not reactive to light, irregular  Assessment/Plan: acute hydrocephalus Secondary to acute change in exam per Dr. LiCheral MarkerI will place a ventricular catheter emergently. Family not available, will call on phone.   Telicia Hodgkiss L 04/04/2017, 12:16 AM

## 2017-04-04 NOTE — Progress Notes (Signed)
Pt became hypertensive and tachycardic (140s).  Increase tremors bilateral arms.  Cleviprex turn on and titrated to BP goals.  Pt was able to state name and follow simple commands (grip/toes).  Neurology was on unit at time of event and order metopropolol.  Before giving medication, pt's tremor, bp and hr decrease.  Neurology updated on event.  Half hour later, pt had similar episode (hypertensive, tachy, and increase tremor), metoprolol given.  Pt is now on 4 L nasal cannula.  Will continue to monitor.

## 2017-04-04 NOTE — Progress Notes (Signed)
STROKE TEAM PROGRESS NOTE   SUBJECTIVE (INTERVAL HISTORY) Sister and niece are at the bedside. He had intermittent episodes of tachycardia, hypertension, bilateral upper extremity tremulous overnight.  Repeat CT head showed developing hydrocephalus. NSG consulted and EVD placed.  Repeat CT head stable hematoma and hydrocephalus status post EVD.  On vancomycin for prophylaxis.  This morning, patient eyes open, orientated to self, age, place, following simple commands.  Past swallow for meds crushed in applesauce.  Still has episodes of tachycardia, hypertension and BUE tremulous, given metoprolol 5 mg IV injection.  Will resume p.o. medications including Lamictal, Aricept, Namenda, Cogentin, amlodipine and losartan as well as metoprolol.  Close monitoring  OBJECTIVE Temp:  [98.1 F (36.7 C)-100.1 F (37.8 C)] 98.4 F (36.9 C) (01/01 0800) Pulse Rate:  [66-149] 99 (01/01 0915) Cardiac Rhythm: Sinus tachycardia (01/01 0800) Resp:  [13-54] 26 (01/01 0915) BP: (85-194)/(68-123) 125/87 (01/01 0900) SpO2:  [81 %-100 %] 97 % (01/01 0915)  Recent Labs  Lab 04/03/17 1646 04/03/17 1956 04/04/17 0116 04/04/17 0355 04/04/17 0828  GLUCAP 92 98 109* 83 89   Recent Labs  Lab 04/02/17 1502 04/02/17 1523 04/03/17 0722 04/04/17 0610  NA 140 143 145 146*  K 4.1 4.2 3.6 3.7  CL 110 106 113* 114*  CO2 25  --  23 23  GLUCOSE 94 94 111* 91  BUN 7 8 6 8   CREATININE 1.22 1.20 1.20 1.25*  CALCIUM 9.2  --  9.1 8.9   Recent Labs  Lab 04/02/17 1502  AST 16  ALT 13*  ALKPHOS 61  BILITOT 1.2  PROT 6.4*  ALBUMIN 3.5   Recent Labs  Lab 04/02/17 1502 04/02/17 1523 04/03/17 0722 04/04/17 0610  WBC 6.4  --  7.9 8.9  NEUTROABS 4.0  --   --   --   HGB 14.1 14.3 14.3 14.3  HCT 39.9 42.0 41.7 42.5  MCV 86.0  --  86.5 88.2  PLT 140*  --  153 140*   No results for input(s): CKTOTAL, CKMB, CKMBINDEX, TROPONINI in the last 168 hours. Recent Labs    04/02/17 1502  LABPROT 13.9  INR 1.08    Recent Labs    04/02/17 1540  COLORURINE YELLOW  LABSPEC 1.008  PHURINE 7.0  GLUCOSEU NEGATIVE  HGBUR NEGATIVE  BILIRUBINUR NEGATIVE  KETONESUR NEGATIVE  PROTEINUR NEGATIVE  NITRITE NEGATIVE  LEUKOCYTESUR NEGATIVE       Component Value Date/Time   CHOL 121 04/03/2017 0722   TRIG 47 04/04/2017 0610   HDL 45 04/03/2017 0722   CHOLHDL 2.7 04/03/2017 0722   VLDL 10 04/03/2017 0722   LDLCALC 66 04/03/2017 0722   Lab Results  Component Value Date   HGBA1C 6.0 (H) 04/03/2017      Component Value Date/Time   LABOPIA NONE DETECTED 04/02/2017 1540   COCAINSCRNUR NONE DETECTED 04/02/2017 1540   LABBENZ POSITIVE (A) 04/02/2017 1540   AMPHETMU NONE DETECTED 04/02/2017 1540   THCU NONE DETECTED 04/02/2017 1540   LABBARB NONE DETECTED 04/02/2017 1540    Recent Labs  Lab 04/02/17 1502  ETH <10    I have personally reviewed the radiological images below and agree with the radiology interpretations.  Ct Angio Head and neck W Or Wo Contrast 04/02/2017 IMPRESSION: No intracranial aneurysm or vascular malformation to account for the hemorrhage in the right thalamus and midbrain. Negative for emergent large vessel occlusion. 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 No significant carotid or vertebral artery stenosis in the  neck.   Ct Head Wo Contrast 04/02/2017 IMPRESSION: 1. Stable acute hemorrhage of right thalamus extending into the ventricular system. Stable ventricle size. 2. No interval acute intracranial hemorrhage, infarct, or mass effect. 3. Stable background of chronic microvascular ischemic changes and parenchymal volume loss of the brain.    Ct Head Code Stroke Wo Contrast 04/02/2017 IMPRESSION: 1. Acute hemorrhage in the midbrain with extension at the ventricles. Negative for hydrocephalus. This hemorrhage is most likely due to hypertension, vascular malformation possible 2. Atrophy and moderate chronic microvascular ischemia. 3.  Complete pneumatization of the clivus with an unusual appearance. Question postsurgical defect in the posterior wall of the sphenoid sinus leading to pneumatization of the clivus.   Ct Head Wo Contrast 04/04/2017 IMPRESSION: 1. Interval placement of right frontal approach ventriculostomy catheter with tip in the third ventricle. Slight decrease in size of right lateral ventricle. Stable size of left lateral ventricle. 2. Stable hematoma centered in right thalamus and third ventricles with small volume of hemorrhage in lateral ventricles. 3. Expected postsurgical changes related to right frontal burr hole. 4. No new acute intracranial abnormality identified.   04/03/2017 IMPRESSION: 1. Increase lateral and third ventricle size compatible with developing hydrocephalus. No downward herniation. 2. Stable hemorrhage centered in right thalamus and third ventricle with small volume and lateral ventricles. 3. No new acute hemorrhage, infarct, or focal mass effect identified. 4. Stable chronic microvascular ischemic changes and parenchymal volume loss of the brain.  TTE  - Left ventricle: The cavity size was normal. Systolic function was   vigorous. The estimated ejection fraction was in the range of 65%   to 70%. Wall motion was normal; there were no regional wall   motion abnormalities. Doppler parameters are consistent with   abnormal left ventricular relaxation (grade 1 diastolic   dysfunction). There was no evidence of elevated ventricular   filling pressure by Doppler parameters. - Aortic valve: Trileaflet; mildly thickened, mildly calcified   leaflets. There was no regurgitation. - Mitral valve: There was no regurgitation. - Left atrium: The atrium was normal in size. - Right ventricle: Systolic function was normal. - Right atrium: The atrium was normal in size. - Tricuspid valve: There was mild regurgitation. - Pulmonic valve: There was no regurgitation. - Pulmonary arteries: Systolic pressure  was within the normal   range. - Inferior vena cava: The vessel was normal in size. - Pericardium, extracardiac: There was no pericardial effusion.  EEG pending   PHYSICAL EXAM  Temp:  [98.1 F (36.7 C)-100.1 F (37.8 C)] 98.4 F (36.9 C) (01/01 0800) Pulse Rate:  [66-149] 99 (01/01 0915) Resp:  [13-54] 26 (01/01 0915) BP: (85-194)/(68-123) 125/87 (01/01 0900) SpO2:  [81 %-100 %] 97 % (01/01 0915)  General - Well nourished, well developed, lethargic.  Ophthalmologic - fundi not visualized due to noncooperation.    Cardiovascular - regular rhythm, but intermittent tachycardia up to 140.  Neuro - lethargic but eyes open, awake, orientated to self, age, place but not orientated to people. Severe dysarthria, able to have limited words, but not able to repeat or name. Able to follow limited simple commands, paucity of speech. Pupil left 81mm irregular shape pupil, not reactive to light, right pupil 54mm, not reactive to light (as per sister, bilateral eye surgeries in the past). Not blinking to visual threat on the left. Significant left facial droop, eye mid position, b/l gaze palsy, doll's eye sluggish. Tongue in the middle. Positive corneal and gag. Right UE 3+/5 and left UE 3-/5  with drift to bed. BLE 3/5 on pain stimulation. DTR 1+ and not cooperative on babinski. b/l UE fine resting tremor, L>R, left UE asterixis present. Sensation, coordination not cooperative and gait not tested.   ASSESSMENT/PLAN [name] is a 77 y.o. male with history of HTN, HLD, DM, dementia, prostate cancer presented with AMS with left sided weakness and facial droop. CT showed right thalamus and midbrain ICH with IVH, no hydrocephalus. Admitted to ICU.  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   2D Echo EF 65-70%  LDL 66  HgbA1c 6.0  Heparin subq for VTE prophylaxis  Diet NPO time specified   aspirin 325 mg daily prior to admission, now on No antithrombotic due to [place]  Ongoing aggressive stroke risk factor management  Therapy recommendations:  pending  Disposition:  Pending  Obstructive hydrocephalus  CT repeat showed developing hydrocephalus  NSG consulted and EVD placed  Repeat CT stable ventricle size  NSG on board with vancomycin prophylaxis  EVD patent with minimal drainage  Intermittent tachycardia  Likely due to hypothalamus involvement  Metoprolol 5 mg IV once  Resume home metoprolol 50 mg twice daily  Consider cardiology consultation if needed  BUE resting tremor L>R  Less suspicious for seizure  EEG pending  Likely due to subthalamic nucleus and midbrain involvement  Resume home Lamictal 50 mg twice daily  Resume home Cogentin  Family denies any regular alcohol drinking - add B1, MVI and FA  Diabetes  HgbA1c 6.0 goal < 7.0  Controlled  Home meds on levemir  CBG monitoring  SSI  Hypertension Stable BP goal < 160 now as repeat CT showed stable ICH Wean off Cleviprex as able Start po BP meds with Norvasc, losartan and metoprolol  Long term BP goal normotensive  Hyperlipidemia  Home meds:  lipitor 10   LDL 66, goal < 70  Resume lipitor on discharge  Tobacco abuse  Current smoker  Smoking cessation counseling will be provided  Other Stroke Risk Factors  Advanced age  ETOH use - minimal as per sisters  Other Active Problems  Dementia with behavior disturbance on nemanda and lamictal - resume Aricept and Namenda   Prostate cancer - stable followed by urology  Hospital day # 2  This patient is critically ill due to [place] with IVH, hypertensive  emergency, smoker and at significant risk of neurological worsening, death form hematoma expansion, stroke, seizure, heart failure. This patient's care requires constant monitoring of vital signs, hemodynamics, respiratory and cardiac monitoring, review of multiple databases, neurological assessment, discussion with family, other specialists and medical decision making of high complexity. I had long discussion with sister and niece at bedside, updated pt current condition, treatment plan and potential prognosis. They expressed understanding and appreciation. I spent 45 minutes of neurocritical care time in the care of this patient.  Rosalin Hawking, MD PhD Stroke Neurology 04/04/2017 10:10 AM    To contact Stroke Continuity provider, please refer to [email or web address] After hours, contact General Neurology

## 2017-04-04 NOTE — Procedures (Signed)
  ELECTROENCEPHALOGRAM REPORT  Date of Study: 04/04/17  Patient's Name: [name] MRN: [id number] Date of Birth: 1941-03-28  Referring Provider: Rosalin Hawking, MD  Clinical History: Antonio Flynn is a 77 y.o. M with an intracranial hemorrhage causing obstruction of the lll ventricle, and hydrocephalus. CT scan shows right thalamus, mid-brain ICH with IVH. PMH includes dementia and ETOH.  He had intermittent episodes of tachycardia, hypertension, bilateral upper extremity tremulous overnight.  Repeat CT head showed developing hydrocephalus. NSG consulted and EVD placed.  Repeat CT head stable hematoma and hydrocephalus status post EVD.  On vancomycin for prophylaxis. Pt has some twitching and jerky movements. r/o any seizures   Medications: Scheduled Meds: .  stroke: mapping our early stages of recovery book   Does not apply Once  . amLODipine  10 mg Oral Daily  . benztropine  0.5 mg Oral BID  . donepezil  10 mg Oral QHS  . folic acid  1 mg Oral Daily  . heparin injection (subcutaneous)  5,000 Units Subcutaneous Q8H  . insulin aspart  0-9 Units Subcutaneous Q6H  . lamoTRIgine  50 mg Oral BID  . losartan  100 mg Oral Daily  . mouth rinse  15 mL Mouth Rinse BID  . memantine  21 mg Oral Daily  . metoprolol tartrate  50 mg Oral BID  . mirabegron ER  50 mg Oral [id number]  . multivitamin  1 tablet Oral Daily  . pantoprazole  40 mg Oral Daily  . senna-docusate  1 tablet Oral BID  . tamsulosin  0.4 mg Oral BID  . thiamine  100 mg Oral Daily   Continuous Infusions: . sodium chloride 75 mL/hr at 04/04/17 1400  . clevidipine 1 mg/hr (04/04/17 1400)  . [START ON 04/05/2017] vancomycin     PRN Meds:.acetaminophen **OR** acetaminophen (TYLENOL) oral liquid 160 mg/5 mL **OR** acetaminophen, ondansetron (ZOFRAN) IV            Technical Summary: This is a standard 16 channel EEG recording performed according to the international 10-20 electrode system.  AP bipolar, transverse bipolar, and  referential montages were obtained, and digitally reformatted as necessary.  Duration of tracing: 21:43  Description: Pt is lethargic, but will follow some simple commands.  Background is disorganized with generalized slowing throughout the tracing and intermittent background muscle artifact. There is a 6Hz  theta rhythm that is posterior predominant, with bifrontal 1-2 Hz slowing. Various movements were noted (eg, Lt arm twitching, jaw tremor) without epileptiform changes.  Neither HV or photic were performed.  EKG was performed and noted to be SR at 90 bpm.  No epileptiform changes were note.  Impression: This is an abnormal EEG due to background slowing and disorganization seen throughout the tracing.  This is a non-specific finding that can be seen with toxic, metabolic, diffuse, or multifocal structural processes.  No definite epileptiform changes were noted.  Occasional movements were noted (twitching, jaw tremors), however not epileptiform changes were seen.  A single EEG without epileptiform changes does not exclude the diagnosis of epilepsy. Clinical correlation advised.   Carvel Getting, M.D. Neurology Cell [phone]

## 2017-04-04 NOTE — Op Note (Signed)
 *   No surgery found *  PATIENT:   Antonio Flynn  77 y.o. male with an intracranial hemorrhage causing obstruction of the lll ventricle, and hydrocephalus.    PRE-OPERATIVE DIAGNOSIS: obstructive hydrocephalus  POST-OPERATIVE DIAGNOSIS:  Same  PROCEDURE:  Right frontal ventricular catheter placement  SURGEON:  Arman Loy ANESTHESIA:   local DRAINS: Ventriculostomy Drain in the right lateral ventricle.   SPECIMEN: none  DICTATION: Otilio Carpen had male  head shaved and then prepared in a sterile manner. I draped male  in a sterile manner. I injected lidocaine into the planned coronal incision in line with the right pupillary line anterior to the tragus. I opened the skin with a 15 blade. I used the hand twist drill to create a burr hole. I opened the dura with the 20 gauge spinal needle. I passed the ventricular catheter into the lateral ventricle. There was brisk flow of spinal fluid. I tunneled the catheter posteriorly and secured it to the skin at the exit. I approximated the scalp edges with nylon sutures. I placed a sterile dressing, then connected the catheter to the drainage system.   PLAN OF CARE: Admit to inpatient   PATIENT DISPOSITION:  PACU - hemodynamically stable.

## 2017-04-04 NOTE — Progress Notes (Signed)
EEG completed, results pending. 

## 2017-04-05 ENCOUNTER — Inpatient Hospital Stay (HOSPITAL_COMMUNITY): Payer: Medicare Other

## 2017-04-05 DIAGNOSIS — E870 Hyperosmolality and hypernatremia: Secondary | ICD-10-CM

## 2017-04-05 DIAGNOSIS — N179 Acute kidney failure, unspecified: Secondary | ICD-10-CM

## 2017-04-05 DIAGNOSIS — E43 Unspecified severe protein-calorie malnutrition: Secondary | ICD-10-CM | POA: Diagnosis present

## 2017-04-05 DIAGNOSIS — R1312 Dysphagia, oropharyngeal phase: Secondary | ICD-10-CM

## 2017-04-05 DIAGNOSIS — R1310 Dysphagia, unspecified: Secondary | ICD-10-CM

## 2017-04-05 LAB — GLUCOSE, CAPILLARY
GLUCOSE-CAPILLARY: 131 mg/dL — AB (ref 65–99)
GLUCOSE-CAPILLARY: 131 mg/dL — AB (ref 65–99)
GLUCOSE-CAPILLARY: 88 mg/dL (ref 65–99)
GLUCOSE-CAPILLARY: 99 mg/dL (ref 65–99)
Glucose-Capillary: 112 mg/dL — ABNORMAL HIGH (ref 65–99)

## 2017-04-05 LAB — BASIC METABOLIC PANEL
Anion gap: 4 — ABNORMAL LOW (ref 5–15)
BUN: 10 mg/dL (ref 6–20)
CHLORIDE: 118 mmol/L — AB (ref 101–111)
CO2: 24 mmol/L (ref 22–32)
Calcium: 8.5 mg/dL — ABNORMAL LOW (ref 8.9–10.3)
Creatinine, Ser: 1.35 mg/dL — ABNORMAL HIGH (ref 0.61–1.24)
GFR calc Af Amer: 58 mL/min — ABNORMAL LOW (ref >60)
GFR, EST NON AFRICAN AMERICAN: 50 mL/min — AB (ref >60)
GLUCOSE: 98 mg/dL (ref 65–99)
POTASSIUM: 3.3 mmol/L — AB (ref 3.5–5.1)
Sodium: 146 mmol/L — ABNORMAL HIGH (ref 135–145)

## 2017-04-05 LAB — MAGNESIUM
MAGNESIUM: 2 mg/dL (ref 1.7–2.4)
MAGNESIUM: 1.8 mg/dL (ref 1.7–2.4)

## 2017-04-05 LAB — CBC
HCT: 37.3 % — ABNORMAL LOW (ref 39.0–52.0)
HEMOGLOBIN: 12.4 g/dL — AB (ref 13.0–17.0)
MCH: 29.1 pg (ref 26.0–34.0)
MCHC: 33.2 g/dL (ref 30.0–36.0)
MCV: 87.6 fL (ref 78.0–100.0)
PLATELETS: 130 10*3/uL — AB (ref 150–400)
RBC: 4.26 MIL/uL (ref 4.22–5.81)
RDW: 15 % (ref 11.5–15.5)
WBC: 8.5 10*3/uL (ref 4.0–10.5)

## 2017-04-05 LAB — PHOSPHORUS
PHOSPHORUS: 2.9 mg/dL (ref 2.5–4.6)
Phosphorus: 2.5 mg/dL (ref 2.5–4.6)

## 2017-04-05 MED ORDER — ORAL CARE MOUTH RINSE
15.0000 mL | Freq: Two times a day (BID) | OROMUCOSAL | Status: DC
  Administered 2017-04-05 – 2017-04-11 (×12): 15 mL via OROMUCOSAL

## 2017-04-05 MED ORDER — AMANTADINE HCL 50 MG/5ML PO SYRP
100.0000 mg | ORAL_SOLUTION | Freq: Two times a day (BID) | ORAL | Status: DC
  Filled 2017-04-05: qty 10

## 2017-04-05 MED ORDER — LAMOTRIGINE 25 MG PO TABS
50.0000 mg | ORAL_TABLET | Freq: Two times a day (BID) | ORAL | Status: DC
  Administered 2017-04-05 – 2017-04-11 (×12): 50 mg
  Filled 2017-04-05: qty 2
  Filled 2017-04-05: qty 1
  Filled 2017-04-05 (×3): qty 2
  Filled 2017-04-05: qty 1
  Filled 2017-04-05: qty 2
  Filled 2017-04-05: qty 1
  Filled 2017-04-05 (×3): qty 2
  Filled 2017-04-05: qty 1
  Filled 2017-04-05 (×2): qty 2

## 2017-04-05 MED ORDER — PRO-STAT SUGAR FREE PO LIQD
30.0000 mL | Freq: Three times a day (TID) | ORAL | Status: DC
  Administered 2017-04-05 – 2017-04-09 (×12): 30 mL
  Filled 2017-04-05 (×12): qty 30

## 2017-04-05 MED ORDER — POTASSIUM CHLORIDE 20 MEQ/15ML (10%) PO SOLN
40.0000 meq | ORAL | Status: AC
  Administered 2017-04-05 (×2): 40 meq
  Filled 2017-04-05 (×2): qty 30

## 2017-04-05 MED ORDER — CHLORHEXIDINE GLUCONATE 0.12 % MT SOLN
15.0000 mL | Freq: Two times a day (BID) | OROMUCOSAL | Status: DC
  Administered 2017-04-05 – 2017-04-11 (×13): 15 mL via OROMUCOSAL
  Filled 2017-04-05 (×7): qty 15

## 2017-04-05 MED ORDER — INSULIN ASPART 100 UNIT/ML ~~LOC~~ SOLN
0.0000 [IU] | SUBCUTANEOUS | Status: DC
  Administered 2017-04-06 (×5): 1 [IU] via SUBCUTANEOUS
  Administered 2017-04-06: 2 [IU] via SUBCUTANEOUS
  Administered 2017-04-07: 1 [IU] via SUBCUTANEOUS
  Administered 2017-04-07: 3 [IU] via SUBCUTANEOUS
  Administered 2017-04-07: 1 [IU] via SUBCUTANEOUS
  Administered 2017-04-07 (×2): 2 [IU] via SUBCUTANEOUS
  Administered 2017-04-07: 1 [IU] via SUBCUTANEOUS
  Administered 2017-04-08 (×2): 2 [IU] via SUBCUTANEOUS
  Administered 2017-04-08: 3 [IU] via SUBCUTANEOUS
  Administered 2017-04-08 (×2): 2 [IU] via SUBCUTANEOUS
  Administered 2017-04-08: 3 [IU] via SUBCUTANEOUS
  Administered 2017-04-09: 2 [IU] via SUBCUTANEOUS
  Administered 2017-04-09: 1 [IU] via SUBCUTANEOUS
  Administered 2017-04-09: 3 [IU] via SUBCUTANEOUS
  Administered 2017-04-09: 2 [IU] via SUBCUTANEOUS
  Administered 2017-04-09: 5 [IU] via SUBCUTANEOUS
  Administered 2017-04-09: 3 [IU] via SUBCUTANEOUS
  Administered 2017-04-10: 2 [IU] via SUBCUTANEOUS
  Administered 2017-04-10 (×3): 1 [IU] via SUBCUTANEOUS
  Administered 2017-04-10 (×3): 2 [IU] via SUBCUTANEOUS
  Administered 2017-04-11: 1 [IU] via SUBCUTANEOUS
  Administered 2017-04-11: 2 [IU] via SUBCUTANEOUS
  Administered 2017-04-11: 5 [IU] via SUBCUTANEOUS
  Administered 2017-04-11: 3 [IU] via SUBCUTANEOUS

## 2017-04-05 MED ORDER — AMLODIPINE BESYLATE 10 MG PO TABS
10.0000 mg | ORAL_TABLET | Freq: Every day | ORAL | Status: DC
  Administered 2017-04-05 – 2017-04-07 (×3): 10 mg
  Filled 2017-04-05 (×3): qty 1

## 2017-04-05 MED ORDER — SENNOSIDES-DOCUSATE SODIUM 8.6-50 MG PO TABS
1.0000 | ORAL_TABLET | Freq: Two times a day (BID) | ORAL | Status: DC
  Administered 2017-04-07 – 2017-04-11 (×8): 1
  Filled 2017-04-05 (×11): qty 1

## 2017-04-05 MED ORDER — PANTOPRAZOLE SODIUM 40 MG PO PACK
40.0000 mg | PACK | Freq: Every day | ORAL | Status: DC
  Administered 2017-04-05 – 2017-04-11 (×7): 40 mg
  Filled 2017-04-05 (×5): qty 20

## 2017-04-05 MED ORDER — SODIUM CHLORIDE 0.9 % IV BOLUS (SEPSIS)
500.0000 mL | Freq: Once | INTRAVENOUS | Status: AC
  Administered 2017-04-05: 500 mL via INTRAVENOUS

## 2017-04-05 MED ORDER — VITAMIN B-1 100 MG PO TABS
100.0000 mg | ORAL_TABLET | Freq: Every day | ORAL | Status: DC
  Administered 2017-04-05 – 2017-04-11 (×7): 100 mg
  Filled 2017-04-05 (×7): qty 1

## 2017-04-05 MED ORDER — AMANTADINE HCL 50 MG/5ML PO SYRP
100.0000 mg | ORAL_SOLUTION | Freq: Two times a day (BID) | ORAL | Status: DC
  Administered 2017-04-05 – 2017-04-11 (×13): 100 mg
  Filled 2017-04-05 (×15): qty 10

## 2017-04-05 MED ORDER — FOLIC ACID 1 MG PO TABS
1.0000 mg | ORAL_TABLET | Freq: Every day | ORAL | Status: DC
  Administered 2017-04-05 – 2017-04-11 (×7): 1 mg
  Filled 2017-04-05 (×7): qty 1

## 2017-04-05 MED ORDER — DONEPEZIL HCL 10 MG PO TABS
10.0000 mg | ORAL_TABLET | Freq: Every day | ORAL | Status: DC
  Administered 2017-04-05 – 2017-04-10 (×6): 10 mg
  Filled 2017-04-05 (×7): qty 1

## 2017-04-05 MED ORDER — LOSARTAN POTASSIUM 50 MG PO TABS
100.0000 mg | ORAL_TABLET | Freq: Every day | ORAL | Status: DC
  Administered 2017-04-05 – 2017-04-11 (×7): 100 mg
  Filled 2017-04-05 (×9): qty 2

## 2017-04-05 MED ORDER — JEVITY 1.2 CAL PO LIQD
1000.0000 mL | ORAL | Status: DC
  Administered 2017-04-05 – 2017-04-07 (×3): 1000 mL
  Filled 2017-04-05 (×5): qty 1000

## 2017-04-05 MED ORDER — BENZTROPINE MESYLATE 0.5 MG PO TABS
0.5000 mg | ORAL_TABLET | Freq: Two times a day (BID) | ORAL | Status: DC
  Administered 2017-04-05 – 2017-04-11 (×13): 0.5 mg
  Filled 2017-04-05 (×14): qty 1

## 2017-04-05 NOTE — Progress Notes (Signed)
Patient ID: Antonio Flynn, male   DOB: 1941-03-01, 77 y.o.   MRN: [id number] BP 127/60   Pulse 72   Temp 99.4 F (37.4 C) (Axillary)   Resp (!) 25   Ht 5\' 8"  (1.727 m)   Wt 66.2 kg (145 lb 15.1 oz)   SpO2 96%   BMI 22.19 kg/m  ventric is pulsatile,  Minimal drainage. Csf is clear. Will clamp, probably removing drain tomorrow.

## 2017-04-05 NOTE — Progress Notes (Signed)
Initial Nutrition Assessment  DOCUMENTATION CODES:   Severe malnutrition in context of chronic illness  INTERVENTION:   Initiate TF via Cortrak tube:  Jevity 1.2 @ 35 ml/hr and increase by 10 ml every 12 hours to goal rate of 55 ml/hr (1320 ml/day) 30 ml Prostat TID Provides: 1884 kcal, 118 grams protein, and 1070 ml free water.    NUTRITION DIAGNOSIS:   Severe Malnutrition related to chronic illness as evidenced by severe fat depletion, severe muscle depletion.  GOAL:   Patient will meet greater than or equal to 90% of their needs  MONITOR:   TF tolerance, I & O's  REASON FOR ASSESSMENT:   Consult Assessment of nutrition requirement/status  ASSESSMENT:   Pt with PMH of HTN, HLD, DM, dementia, prostate cancer admitted with ICH with IVH.    Pt discussed during ICU rounds and with RN.  Failed swallow eval, Cortrak placed 1/2 1/1 EVD placed  RN at bedside, per RN pt has been non-verbal. Pt is shaking at times and chattering his teeth which RN states has been usual for pt this admission.   Medications reviewed and include: folic acid, novolog, MVI, KCl 40 mEq, senokot-s, thiamine  Labs reviewed: Na 146 (H), K+ 3.3 (L)    NUTRITION - FOCUSED PHYSICAL EXAM:    Most Recent Value  Orbital Region  Mild depletion  Upper Arm Region  Severe depletion  Thoracic and Lumbar Region  Severe depletion  Buccal Region  Mild depletion  Temple Region  Severe depletion  Clavicle Bone Region  Severe depletion  Clavicle and Acromion Bone Region  Severe depletion  Scapular Bone Region  Severe depletion  Dorsal Hand  Severe depletion  Patellar Region  Severe depletion  Anterior Thigh Region  Severe depletion  Posterior Calf Region  Severe depletion  Edema (RD Assessment)  None  Hair  Reviewed  Eyes  Reviewed  Mouth  Unable to assess  Skin  Reviewed  Nails  Reviewed       Diet Order:  Diet NPO time specified Except for: Other (See Comments)  EDUCATION NEEDS:   No  education needs have been identified at this time  Skin:  Skin Assessment: Reviewed RN Assessment  Last BM:  unknown  Height:   Ht Readings from Last 1 Encounters:  04/02/17 5\' 8"  (1.727 m)    Weight:   Wt Readings from Last 1 Encounters:  04/02/17 145 lb 15.1 oz (66.2 kg)    Ideal Body Weight:  70 kg  BMI:  Body mass index is 22.19 kg/m.  Estimated Nutritional Needs:   Kcal:  1800-2000  Protein:  100-115 grams  Fluid:  > 1.8 L/day  Maylon Peppers RD, LDN, CNSC [phone] Pager [phone] After Hours Pager

## 2017-04-05 NOTE — Evaluation (Signed)
Physical Therapy Evaluation Patient Details Name: Antonio Flynn MRN: [id number] DOB: 15-Oct-1940 Today's Date: 04/05/2017   History of Present Illness  77 y.o. male admitted from SNF on 04/02/17 with AMS and L facial droop and left sided weakness.  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 Pt with significant PMH ofHTN, DM type I, and dementia.  Clinical Impression  Pt with significant mobility deficits requiring max to total assist to get to EOB and to stand EOB.  We are not sure of prior baseline.  He is appropriate for SNF placement at discharge.   PT to follow acutely for deficits listed below.       Follow Up Recommendations SNF    Equipment Recommendations  Wheelchair (measurements PT);Wheelchair cushion (measurements PT);Hospital bed;Other (comment)(hoyer lift)    Recommendations for Other Services   NA     Precautions / Restrictions Precautions Precautions: Fall Precaution Comments: IVD, clamp before moving pt Required Braces or Orthoses: Other Brace/Splint Other Brace/Splint: bil mittens Restrictions Weight Bearing Restrictions: No      Mobility  Bed Mobility Overal bed mobility: Needs Assistance Bed Mobility: Supine to Sit;Sit to Supine     Supine to sit: Total assist;+2 for physical assistance Sit to supine: Total assist;+2 for physical assistance   General bed mobility comments: Assist for LEs and trunk. Use of bed pad to scoot out to EOB.  Pt leaning to the left when attempting to retrun to bed, assist needed again at trunk and bil LEs to return to supine.   Transfers Overall transfer level: Needs assistance Equipment used: 2 person hand held assist Transfers: Sit to/from Stand Sit to Stand: Total assist;+2 physical assistance         General transfer comment: to boost up and for balance in standing. Blocked bil LEs.  Pt with posterior lean.   Ambulation/Gait             General  Gait Details: unable       Modified Rankin (Stroke Patients Only) Modified Rankin (Stroke Patients Only) Pre-Morbid Rankin Score: (unknown) Modified Rankin: Severe disability     Balance Overall balance assessment: Needs assistance Sitting-balance support: Feet supported;Bilateral upper extremity supported Sitting balance-Leahy Scale: Poor Sitting balance - Comments: Posterior and L lateral pushing throghout. Max assist for sitting balance. Postural control: Posterior lean;Left lateral lean Standing balance support: Bilateral upper extremity supported Standing balance-Leahy Scale: Zero Standing balance comment: total assist +2 with progressively increasing left lateral lean the longer we stood.                              Pertinent Vitals/Pain Pain Assessment: No/denies pain Faces Pain Scale: No hurt    Home Living Family/patient expects to be discharged to:: Skilled nursing facility                      Prior Function           Comments: unsure of baseline        Extremity/Trunk Assessment   Upper Extremity Assessment Upper Extremity Assessment: Defer to OT evaluation    Lower Extremity Assessment Lower Extremity Assessment: RLE deficits/detail;LLE deficits/detail RLE Deficits / Details: Pt is able to move LEs, not well, but uses them to push posteriorly into the bed and actively attempting to move them against gravity.  No obvious difference left vs right.  LLE Deficits / Details:  Pt is able to move LEs, not well, but uses them to push posteriorly into the bed and actively attempting to move them against gravity.  No obvious difference left vs right.     Cervical / Trunk Assessment Cervical / Trunk Assessment: Kyphotic;Other exceptions Cervical / Trunk Exceptions: L head turn with left lateral cervical flexion  Communication   Communication: No difficulties(pt able to speak in full sentances at times)  Cognition Arousal/Alertness:  Awake/alert Behavior During Therapy: Restless(mildly agitated with stimuli) Overall Cognitive Status: No family/caregiver present to determine baseline cognitive functioning                                 General Comments: Pt able to tell me he was at the hospital in [place], but could not tell me why, or what month/year it is currently.       General Comments General comments (skin integrity, edema, etc.): Pt with tremors at baseline per chart, and theeth gnashing/grinding.     Exercises     Assessment/Plan    PT Assessment Patient needs continued PT services  PT Problem List Decreased strength;Decreased activity tolerance;Decreased range of motion;Decreased balance;Decreased mobility;Decreased coordination;Decreased cognition;Decreased knowledge of use of DME;Decreased safety awareness;Decreased knowledge of precautions       PT Treatment Interventions DME instruction;Gait training;Functional mobility training;Therapeutic activities;Therapeutic exercise;Balance training;Neuromuscular re-education;Cognitive remediation;Patient/family education    PT Goals (Current goals can be found in the Care Plan section)  Acute Rehab PT Goals Patient Stated Goal: none stated PT Goal Formulation: Patient unable to participate in goal setting Time For Goal Achievement: 04/19/17 Potential to Achieve Goals: Good    Frequency Min 3X/week        Co-evaluation PT/OT/SLP Co-Evaluation/Treatment: Yes Reason for Co-Treatment: Complexity of the patient's impairments (multi-system involvement);Necessary to address cognition/behavior during functional activity;For patient/therapist safety;To address functional/ADL transfers PT goals addressed during session: Mobility/safety with mobility;Balance;Strengthening/ROM         AM-PAC PT "6 Clicks" Daily Activity  Outcome Measure Difficulty turning over in bed (including adjusting bedclothes, sheets and blankets)?: Unable Difficulty  moving from lying on back to sitting on the side of the bed? : Unable Difficulty sitting down on and standing up from a chair with arms (e.g., wheelchair, bedside commode, etc,.)?: Unable Help needed moving to and from a bed to chair (including a wheelchair)?: Total Help needed walking in hospital room?: Total Help needed climbing 3-5 steps with a railing? : Total 6 Click Score: 6    End of Session Equipment Utilized During Treatment: Gait belt Activity Tolerance: Patient tolerated treatment well Patient left: in bed;with call bell/phone within reach;with bed alarm set Nurse Communication: Mobility status PT Visit Diagnosis: Unsteadiness on feet (R26.81);Muscle weakness (generalized) (M62.81);Difficulty in walking, not elsewhere classified (R26.2)    Time: 6387-5643 PT Time Calculation (min) (ACUTE ONLY): 23 min   Charges:       Wells Guiles B. Jade Burkard, PT, DPT [phone]   PT Evaluation $PT Eval Moderate Complexity: 1 Mod     04/05/2017, 11:47 PM

## 2017-04-05 NOTE — Progress Notes (Signed)
  Speech Language Pathology Treatment: Dysphagia  Patient Details Name: Antonio Flynn MRN: [id number] DOB: 1940/06/09 Today's Date: 04/05/2017 Time: 5188-4166 SLP Time Calculation (min) (ACUTE ONLY): 19 min  Assessment / Plan / Recommendation Clinical Impression  Patient with fluctuating levels of alertness since treatment 1/1. Patient lethargic this am, eventually opening eyes with max tactile and verbal cueing. Able to maintain eye opening however unable to follow commands. Po trials initiated however resulted in complete oral holding of bolus despite max verbal, visual, and tactile cues. Oral cavity eventually suctioned by SLP. RN in room and reported that overnight, patient with significant pocketing of bolus. Recommend NPO including meds with placement of temporary non-oral means of nutrition. Prognosis for ability to resume a po diet good with improved alertness. SLP will continue to f/u.    HPI HPI: 77 year old male admitted from SNF with left facial droop, left arm/leg weakness, severely altered mentation, severe aphasia and dysarthria. CT scan shows right thalamus, mid-brain ICH with IVH. PMH includes dementia and ETOH.       SLP Plan  Continue with current plan of care       Recommendations  Diet recommendations: NPO Medication Administration: Via alternative means                Oral Care Recommendations: Oral care QID Follow up Recommendations: Skilled Nursing facility SLP Visit Diagnosis: Dysphagia, oropharyngeal phase (R13.12) Plan: Continue with current plan of care          [facility] [place], [place] [phone]    Antonio Flynn [name] 04/05/2017, 8:46 AM

## 2017-04-05 NOTE — Evaluation (Signed)
Occupational Therapy Evaluation Patient Details Name: Antonio Flynn MRN: [id number] DOB: 1940-05-27 Today's Date: 04/05/2017    History of Present Illness 77 y.o. male admitted from SNF on 04/02/17 with AMS and L facial droop and left sided weakness.  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 Pt with significant PMH ofHTN, DM type I, and dementia.   Clinical Impression   Unsure of pts functional baseline. Currently pt requires total assist +2 for all aspects of mobility. Pt tolerated sitting at EOB x10 minutes with RR elevated in 30s and tremors noted. Pt with strong posterior and L lateral pushing in sitting requiring max assist to maintain upright posture. Pt presenting with cognitive deficits, L head turn and gaze preference, generalized weakness, poor balance impacting his independence and safety with ADL and functional mobility. Pt from SNF, recommending return to SNF for continued rehab at d/c. Pt would benefit from continued skilled OT to address established goals.    Follow Up Recommendations  SNF;Supervision/Assistance - 24 hour    Equipment Recommendations  Other (comment)(TBD at next venue)    Recommendations for Other Services       Precautions / Restrictions Precautions Precautions: Fall Precaution Comments: IVD, clamp before moving pt Required Braces or Orthoses: Other Brace/Splint Other Brace/Splint: bil mittens Restrictions Weight Bearing Restrictions: No      Mobility Bed Mobility Overal bed mobility: Needs Assistance Bed Mobility: Supine to Sit;Sit to Supine     Supine to sit: Total assist;+2 for physical assistance Sit to supine: Total assist;+2 for physical assistance   General bed mobility comments: Assist for LEs and trunk. Use of bed pad to scoot out to EOB.  Transfers Overall transfer level: Needs assistance Equipment used: 2 person hand held assist Transfers: Sit to/from  Stand Sit to Stand: Total assist;+2 physical assistance         General transfer comment: to boost up and for balance in standing. Blocked bil LEs.    Balance Overall balance assessment: Needs assistance Sitting-balance support: Feet supported;Bilateral upper extremity supported Sitting balance-Leahy Scale: Poor Sitting balance - Comments: Posterior and L lateral pushing throghout. Max assist for sitting balance. Postural control: Posterior lean;Left lateral lean   Standing balance-Leahy Scale: Zero Standing balance comment: total assist +2                           ADL either performed or assessed with clinical judgement   ADL Overall ADL's : Needs assistance/impaired                                       General ADL Comments: Total assist for ADL at this time. Increased RR sitting at EOB.     Vision   Additional Comments: Difficult to assess due to impaired cognition. L head turn and gaze preference.     Perception     Praxis      Pertinent Vitals/Pain Pain Assessment: No/denies pain     Hand Dominance     Extremity/Trunk Assessment Upper Extremity Assessment Upper Extremity Assessment: Difficult to assess due to impaired cognition(active movement noted bil)   Lower Extremity Assessment Lower Extremity Assessment: Defer to PT evaluation RLE Deficits / Details: Pt is able to move LEs, not well, but uses them to push posteriorly into the bed and actively attempting to move them against  gravity.  No obvious difference left vs right.  LLE Deficits / Details: Pt is able to move LEs, not well, but uses them to push posteriorly into the bed and actively attempting to move them against gravity.  No obvious difference left to right.    Cervical / Trunk Assessment Cervical / Trunk Assessment: Kyphotic;Other exceptions Cervical / Trunk Exceptions: L head turn   Communication Communication Communication: No difficulties(pt able to speak in full  sentances at times)   Cognition Arousal/Alertness: Awake/alert Behavior During Therapy: Restless(mildly agitated with stimuli) Overall Cognitive Status: No family/caregiver present to determine baseline cognitive functioning                                     General Comments       Exercises     Shoulder Instructions      Home Living Family/patient expects to be discharged to:: Skilled nursing facility                                        Prior Functioning/Environment          Comments: unsure of baseline        OT Problem List:        OT Treatment/Interventions: Self-care/ADL training;Therapeutic exercise;Neuromuscular education;Energy conservation;DME and/or AE instruction;Therapeutic activities;Cognitive remediation/compensation;Visual/perceptual remediation/compensation;Patient/family education;Balance training    OT Goals(Current goals can be found in the care plan section) Acute Rehab OT Goals Patient Stated Goal: none stated OT Goal Formulation: With patient Time For Goal Achievement: 04/19/17 Potential to Achieve Goals: Fair  OT Frequency: Min 2X/week   Barriers to D/C:            Co-evaluation PT/OT/SLP Co-Evaluation/Treatment: Yes Reason for Co-Treatment: Complexity of the patient's impairments (multi-system involvement);Necessary to address cognition/behavior during functional activity;For patient/therapist safety   OT goals addressed during session: Strengthening/ROM      AM-PAC PT "6 Clicks" Daily Activity     Outcome Measure Help from another person eating meals?: Total Help from another person taking care of personal grooming?: Total Help from another person toileting, which includes using toliet, bedpan, or urinal?: Total Help from another person bathing (including washing, rinsing, drying)?: Total Help from another person to put on and taking off regular upper body clothing?: Total Help from another person  to put on and taking off regular lower body clothing?: Total 6 Click Score: 6   End of Session Nurse Communication: Mobility status  Activity Tolerance: Patient tolerated treatment well Patient left: in bed;with call bell/phone within reach;with bed alarm set;with SCD's reapplied;with restraints reapplied  OT Visit Diagnosis: Unsteadiness on feet (R26.81);Other abnormalities of gait and mobility (R26.89);Muscle weakness (generalized) (M62.81);Other symptoms and signs involving cognitive function                Time: 1511-1540 OT Time Calculation (min): 29 min Charges:  OT General Charges $OT Visit: 1 Visit OT Evaluation $OT Eval Moderate Complexity: 1 Mod G-Codes:     Jaxen Samples A. Ulice Brilliant, M.S., OTR/L Pager: [place] 04/05/2017, 5:23 PM

## 2017-04-05 NOTE — Progress Notes (Signed)
OT Cancellation Note  Patient Details Name: Antonio Flynn MRN: [id number] DOB: 02-03-1941   Cancelled Treatment:    Reason Eval/Treat Not Completed: Patient not medically ready(bedrest orders and EVD in place).  Binnie Kand M.S., OTR/L Pager: [phone]  04/05/2017, 9:28 AM

## 2017-04-05 NOTE — Progress Notes (Signed)
STROKE TEAM PROGRESS NOTE   SUBJECTIVE (INTERVAL HISTORY) Sister is at the bedside. Vital stable over night. EVD drainage minimal. Slightly more drowsy than yesterday, and nonverbal this am but following all commands. No more tremors. EEG neg for seizure. Did not pass swallow, will need cortrak tube for nutrition and meds. Will repeat CT head.   OBJECTIVE Temp:  [98.5 F (36.9 C)-100.3 F (37.9 C)] 99.3 F (37.4 C) (01/02 0800) Pulse Rate:  [57-110] 71 (01/02 0700) Cardiac Rhythm: Normal sinus rhythm (01/01 2000) Resp:  [18-39] 18 (01/02 0700) BP: (111-149)/(73-111) 130/81 (01/02 0700) SpO2:  [95 %-100 %] 98 % (01/02 0700)  Recent Labs  Lab 04/04/17 0828 04/04/17 1147 04/04/17 1606 04/04/17 2326 04/05/17 0606  GLUCAP 89 123* 115* 112* 88   Recent Labs  Lab 04/02/17 1502 04/02/17 1523 04/03/17 0722 04/04/17 0610 04/05/17 0412  NA 140 143 145 146* 146*  K 4.1 4.2 3.6 3.7 3.3*  CL 110 106 113* 114* 118*  CO2 25  --  23 23 24   GLUCOSE 94 94 111* 91 98  BUN 7 8 6 8 10   CREATININE 1.22 1.20 1.20 1.25* 1.35*  CALCIUM 9.2  --  9.1 8.9 8.5*   Recent Labs  Lab 04/02/17 1502  AST 16  ALT 13*  ALKPHOS 61  BILITOT 1.2  PROT 6.4*  ALBUMIN 3.5   Recent Labs  Lab 04/02/17 1502 04/02/17 1523 04/03/17 0722 04/04/17 0610 04/05/17 0412  WBC 6.4  --  7.9 8.9 8.5  NEUTROABS 4.0  --   --   --   --   HGB 14.1 14.3 14.3 14.3 12.4*  HCT 39.9 42.0 41.7 42.5 37.3*  MCV 86.0  --  86.5 88.2 87.6  PLT 140*  --  153 140* 130*   No results for input(s): CKTOTAL, CKMB, CKMBINDEX, TROPONINI in the last 168 hours. Recent Labs    04/02/17 1502  LABPROT 13.9  INR 1.08   Recent Labs    04/02/17 1540  COLORURINE YELLOW  LABSPEC 1.008  PHURINE 7.0  GLUCOSEU NEGATIVE  HGBUR NEGATIVE  BILIRUBINUR NEGATIVE  KETONESUR NEGATIVE  PROTEINUR NEGATIVE  NITRITE NEGATIVE  LEUKOCYTESUR NEGATIVE       Component Value Date/Time   CHOL 121 04/03/2017 0722   TRIG 47 04/04/2017 0610   HDL 45 04/03/2017 0722   CHOLHDL 2.7 04/03/2017 0722   VLDL 10 04/03/2017 0722   LDLCALC 66 04/03/2017 0722   Lab Results  Component Value Date   HGBA1C 6.0 (H) 04/03/2017      Component Value Date/Time   LABOPIA NONE DETECTED 04/02/2017 1540   COCAINSCRNUR NONE DETECTED 04/02/2017 1540   LABBENZ POSITIVE (A) 04/02/2017 1540   AMPHETMU NONE DETECTED 04/02/2017 1540   THCU NONE DETECTED 04/02/2017 1540   LABBARB NONE DETECTED 04/02/2017 1540    Recent Labs  Lab 04/02/17 1502  ETH <10    I have personally reviewed the radiological images below and agree with the radiology interpretations.  Ct Angio Head and neck W Or Wo Contrast 04/02/2017 IMPRESSION: No intracranial aneurysm or vascular malformation to account for the hemorrhage in the right thalamus and midbrain. Negative for emergent large vessel occlusion. 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 No significant carotid or vertebral artery stenosis in the neck.   Ct Head Wo Contrast 04/02/2017 IMPRESSION: 1. Stable acute hemorrhage of right thalamus extending into the ventricular system. Stable ventricle size. 2. No interval acute intracranial hemorrhage, infarct, or mass effect. 3. Stable  background of chronic microvascular ischemic changes and parenchymal volume loss of the brain.    Ct Head Code Stroke Wo Contrast 04/02/2017 IMPRESSION: 1. Acute hemorrhage in the midbrain with extension at the ventricles. Negative for hydrocephalus. This hemorrhage is most likely due to hypertension, vascular malformation possible 2. Atrophy and moderate chronic microvascular ischemia. 3. Complete pneumatization of the clivus with an unusual appearance. Question postsurgical defect in the posterior wall of the sphenoid sinus leading to pneumatization of the clivus.   Ct Head Wo Contrast 04/04/2017 IMPRESSION: 1. Interval placement of right frontal approach ventriculostomy catheter with tip in the third  ventricle. Slight decrease in size of right lateral ventricle. Stable size of left lateral ventricle. 2. Stable hematoma centered in right thalamus and third ventricles with small volume of hemorrhage in lateral ventricles. 3. Expected postsurgical changes related to right frontal burr hole. 4. No new acute intracranial abnormality identified.   04/03/2017 IMPRESSION: 1. Increase lateral and third ventricle size compatible with developing hydrocephalus. No downward herniation. 2. Stable hemorrhage centered in right thalamus and third ventricle with small volume and lateral ventricles. 3. No new acute hemorrhage, infarct, or focal mass effect identified. 4. Stable chronic microvascular ischemic changes and parenchymal volume loss of the brain.  TTE  - Left ventricle: The cavity size was normal. Systolic function was   vigorous. The estimated ejection fraction was in the range of 65%   to 70%. Wall motion was normal; there were no regional wall   motion abnormalities. Doppler parameters are consistent with   abnormal left ventricular relaxation (grade 1 diastolic   dysfunction). There was no evidence of elevated ventricular   filling pressure by Doppler parameters. - Aortic valve: Trileaflet; mildly thickened, mildly calcified   leaflets. There was no regurgitation. - Mitral valve: There was no regurgitation. - Left atrium: The atrium was normal in size. - Right ventricle: Systolic function was normal. - Right atrium: The atrium was normal in size. - Tricuspid valve: There was mild regurgitation. - Pulmonic valve: There was no regurgitation. - Pulmonary arteries: Systolic pressure was within the normal   range. - Inferior vena cava: The vessel was normal in size. - Pericardium, extracardiac: There was no pericardial effusion.  EEG  This is an abnormal EEG due to background slowing and disorganization seen throughout the tracing.  This is a non-specific finding that can be seen with toxic,  metabolic, diffuse, or multifocal structural processes.  No definite epileptiform changes were noted.  Occasional movements were noted (twitching, jaw tremors), however not epileptiform changes were seen.  A single EEG without epileptiform changes does not exclude the diagnosis of epilepsy. Clinical correlation advised.  CT head pending   PHYSICAL EXAM  Temp:  [98.5 F (36.9 C)-100.3 F (37.9 C)] 99.3 F (37.4 C) (01/02 0800) Pulse Rate:  [57-110] 71 (01/02 0700) Resp:  [18-39] 18 (01/02 0700) BP: (111-149)/(73-111) 130/81 (01/02 0700) SpO2:  [95 %-100 %] 98 % (01/02 0700)  General - Well nourished, well developed, lethargic.  Ophthalmologic - fundi not visualized due to noncooperation.    Cardiovascular - regular rhythm and rate.  Neuro - lethargic but very easily open eyes on voice, nonverbal, did not answer questions, but able to follow all simple commands. Pupil left 32mm irregular shape pupil, not reactive to light, right pupil 2.40mm, not reactive to light (as per sister, bilateral eye surgeries in the past). Not blinking to visual threat on the left. Significant left facial droop, eye mid position, b/l gaze palsy, doll's  eye sluggish. Tongue in the middle. BUE 4/5 but LUE with asterixis. BLE 3/5 on pain stimulation. DTR 1+ and not cooperative on babinski. No resting tremor. Sensation, coordination not cooperative and gait not tested.   ASSESSMENT/PLAN [name] is a 77 y.o. male with history of HTN, HLD, DM, dementia, prostate cancer presented with AMS with left sided weakness and facial droop. CT showed right thalamus and midbrain ICH with IVH, no hydrocephalus. Admitted to ICU.  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   2D Echo EF 65-70%  LDL 66  HgbA1c 6.0  Heparin subq for VTE prophylaxis  Diet NPO time specified Except for: Other (See Comments)   aspirin 325 mg daily prior to admission, now on No antithrombotic due to [place]  Ongoing aggressive stroke risk factor management  Therapy recommendations:  pending  Disposition:  Pending  AKI with hypernatremia   Cre 1.2->1.25->1.35  Na 143->146  Likely dehydration  NS bolus  On IVF  Close monitoring  Obstructive hydrocephalus  CT repeat showed developing hydrocephalus  NSG consulted and EVD placed  Repeat CT stable ventricle size  CT repeat pending  NSG on board with vancomycin prophylaxis  EVD patent with minimal drainage  Dysphagia   Did not pass swallow  Will need cortrak  Dietitian consult  Speech following  Intermittent tachycardia, resolved  Likely due to hypothalamus involvement  On home metoprolol 50 mg twice daily  Stable now  BUE resting tremor L>R, resolved  Resolved today  EEG no seizure  Likely due to subthalamic nucleus and midbrain involvement  On home Lamictal 50 mg twice daily  On home Cogentin  Family denies any regular alcohol drinking - on B1, MVI and FA  Diabetes  HgbA1c 6.0 goal < 7.0  Controlled  Home meds on levemir  CBG monitoring  SSI  Hypertension Stable BP goal < 160 now as repeat CT showed stable ICH off Cleviprex  Start po BP meds with Norvasc, losartan and metoprolol  Long term BP goal normotensive  Hyperlipidemia  Home meds:  lipitor 10   LDL 66, goal < 70  Resume lipitor on discharge  Tobacco abuse  Current smoker  Smoking cessation counseling will be provided  Other Stroke Risk Factors  Advanced age  ETOH use - minimal as per sisters  Other Active Problems  Dementia with behavior disturbance on nemanda  and lamictal - resume Aricept and Namenda   Prostate cancer - stable followed by urology  Hospital day # 3  This patient is critically ill due to [place] with IVH, hypertensive emergency, smoker and at significant risk of neurological worsening, death form hematoma expansion, stroke, seizure, heart failure. This patient's care requires constant monitoring of vital signs, hemodynamics, respiratory and cardiac monitoring, review of multiple databases, neurological assessment, discussion with family, other specialists and medical decision making of high complexity. I had long discussion with sister at bedside, updated pt current condition, treatment plan and potential prognosis. She expressed understanding and appreciation. I spent 40 minutes of neurocritical care time in the care of this patient.  Rosalin Hawking, MD PhD Stroke  Neurology 04/05/2017 9:35 AM    To contact Stroke Continuity provider, please refer to [email or web address] After hours, contact General Neurology

## 2017-04-05 NOTE — Progress Notes (Addendum)
PT Cancellation Note  Patient Details Name: Antonio Flynn MRN: [id number] DOB: 1940-07-29   Cancelled Treatment:    Reason Eval/Treat Not Completed: Other (comment).  Pt on bed rest.  EVD may come out today.  PT to check back later today or tomorrow as time allows.  Thanks,    Barbarann Ehlers. Milania Haubner, PT, DPT [phone]   04/05/2017, 12:54 PM      04/05/2017 Noticed new activity order.  Will start assessment later today.    Thanks,  Barbarann Ehlers. [place], [place], DPT [phone]

## 2017-04-05 NOTE — Progress Notes (Signed)
Cortrak Tube Team Note:  Consult received to place a Cortrak feeding tube.   A 10 F Cortrak tube was placed in the right nare and secured with a nasal bridle at 85 cm. Per the Cortrak monitor reading the tube tip is post pyloric.   X-ray is required, abdominal x-ray has been ordered by the Cortrak team. Please confirm tube placement before using the Cortrak tube.   If the tube becomes dislodged please keep the tube and contact the Cortrak team at [email or web address] (password [email or web address]) for replacement.  If after hours and replacement cannot be delayed, place a NG tube and confirm placement with an abdominal x-ray.    Mariana Single RD, LDN Clinical Nutrition Pager # [phone]

## 2017-04-06 DIAGNOSIS — E43 Unspecified severe protein-calorie malnutrition: Secondary | ICD-10-CM

## 2017-04-06 LAB — GLUCOSE, CAPILLARY
GLUCOSE-CAPILLARY: 145 mg/dL — AB (ref 65–99)
GLUCOSE-CAPILLARY: 138 mg/dL — AB (ref 65–99)
Glucose-Capillary: 84 mg/dL (ref 65–99)
Glucose-Capillary: 161 mg/dL — ABNORMAL HIGH (ref 65–99)
Glucose-Capillary: 140 mg/dL — ABNORMAL HIGH (ref 65–99)
Glucose-Capillary: 136 mg/dL — ABNORMAL HIGH (ref 65–99)
Glucose-Capillary: 142 mg/dL — ABNORMAL HIGH (ref 65–99)

## 2017-04-06 LAB — BASIC METABOLIC PANEL
ANION GAP: 5 (ref 5–15)
BUN: 12 mg/dL (ref 6–20)
CALCIUM: 8.8 mg/dL — AB (ref 8.9–10.3)
CHLORIDE: 120 mmol/L — AB (ref 101–111)
CO2: 25 mmol/L (ref 22–32)
Creatinine, Ser: 1.05 mg/dL (ref 0.61–1.24)
GFR calc non Af Amer: >60 mL/min (ref >60)
Glucose, Bld: 147 mg/dL — ABNORMAL HIGH (ref 65–99)
Potassium: 3.6 mmol/L (ref 3.5–5.1)
Sodium: 150 mmol/L — ABNORMAL HIGH (ref 135–145)

## 2017-04-06 LAB — CBC
HCT: 39 % (ref 39.0–52.0)
HEMOGLOBIN: 13.4 g/dL (ref 13.0–17.0)
MCH: 30 pg (ref 26.0–34.0)
MCHC: 34.4 g/dL (ref 30.0–36.0)
MCV: 87.2 fL (ref 78.0–100.0)
Platelets: 95 10*3/uL — ABNORMAL LOW (ref 150–400)
RBC: 4.47 MIL/uL (ref 4.22–5.81)
RDW: 14.7 % (ref 11.5–15.5)
WBC: 8.8 10*3/uL (ref 4.0–10.5)

## 2017-04-06 LAB — MAGNESIUM
MAGNESIUM: 1.9 mg/dL (ref 1.7–2.4)
Magnesium: 1.9 mg/dL (ref 1.7–2.4)

## 2017-04-06 LAB — PHOSPHORUS
PHOSPHORUS: 1.8 mg/dL — AB (ref 2.5–4.6)
Phosphorus: 2 mg/dL — ABNORMAL LOW (ref 2.5–4.6)

## 2017-04-06 MED ORDER — MEMANTINE HCL ER 14 MG PO CP24
14.0000 mg | ORAL_CAPSULE | Freq: Every day | ORAL | Status: DC
  Administered 2017-04-06 – 2017-04-11 (×6): 14 mg via ORAL
  Filled 2017-04-06 (×7): qty 1

## 2017-04-06 MED ORDER — FREE WATER
100.0000 mL | Status: DC
  Administered 2017-04-06 – 2017-04-07 (×6): 100 mL

## 2017-04-06 NOTE — Progress Notes (Signed)
  Speech Language Pathology Treatment: Dysphagia  Patient Details Name: Antonio Flynn MRN: [id number] DOB: [birth year] Today's Date: 04/06/2017 Time: 9379-0240 SLP Time Calculation (min) (ACUTE ONLY): 11 min  Assessment / Plan / Recommendation Clinical Impression  Skilled treatment session focused on dysphagia. SLP facilitated session by providing skilled observation of pt consuming applesauce and thin liquids. Pt was alert and awake when SLP entered room. He was able to maintain arousal for over 20 minutes. Pt with appropriate answers to simple questions, stated that he would love something to eat and would enjoy some applesauce. Pt's vocal quality was dry and vitals were within funcitonal limits. SLP fed pt 4 oz applesauce and 4 oz thin water via spoon and cup sips. Pt with no oral holding and would recommend diet initiation of dypshagia 1 with thin liquids - ONLY WHEN pt is ALERT.    HPI HPI: 77 year old male admitted from SNF with left facial droop, left arm/leg weakness, severely altered mentation, severe aphasia and dysarthria. CT scan shows right thalamus, mid-brain ICH with IVH. PMH includes dementia and ETOH.       SLP Plan  Continue with current plan of care       Recommendations  Diet recommendations: Dysphagia 1 (puree);Thin liquid Liquids provided via: Cup;Teaspoon Medication Administration: Crushed with puree Supervision: Full supervision/cueing for compensatory strategies;Staff to assist with self feeding Compensations: Minimize environmental distractions;Slow rate;Small sips/bites;Monitor for anterior loss Postural Changes and/or Swallow Maneuvers: Seated upright 90 degrees                Oral Care Recommendations: Oral care QID Follow up Recommendations: Skilled Nursing facility SLP Visit Diagnosis: Dysphagia, oropharyngeal phase (R13.12) Plan: Continue with current plan of care       GO                Tawny Raspberry 04/06/2017, 3:13 PM

## 2017-04-06 NOTE — Progress Notes (Signed)
STROKE TEAM PROGRESS NOTE   SUBJECTIVE (INTERVAL HISTORY) Sister is at the bedside. Pt more awake alert today, answer questions appropriately. LUE weakness improved. EVD clamped yesterday. Pending NSG eval to remove EVD.   OBJECTIVE Temp:  [98.7 F (37.1 C)-100.9 F (38.3 C)] 100.2 F (37.9 C) (01/03 1600) Pulse Rate:  [49-95] 49 (01/03 1900) Cardiac Rhythm: Sinus bradycardia (01/03 1600) Resp:  [18-31] 19 (01/03 1900) BP: (96-161)/(54-123) 105/76 (01/03 1900) SpO2:  [96 %-100 %] 99 % (01/03 1900) Weight:  [141 lb 5 oz (64.1 kg)] 141 lb 5 oz (64.1 kg) (01/03 0430)  Recent Labs  Lab 04/06/17 0427 04/06/17 0802 04/06/17 1159 04/06/17 1518 04/06/17 2002  GLUCAP 145* 161* 140* 136* 138*   Recent Labs  Lab 04/02/17 1502 04/02/17 1523 04/03/17 0722 04/04/17 0610 04/05/17 0412 04/05/17 1446 04/05/17 1704 04/06/17 0433 04/06/17 1610  NA 140 143 145 146* 146*  --   --  150*  --   K 4.1 4.2 3.6 3.7 3.3*  --   --  3.6  --   CL 110 106 113* 114* 118*  --   --  120*  --   CO2 25  --  23 23 24   --   --  25  --   GLUCOSE 94 94 111* 91 98  --   --  147*  --   BUN 7 8 6 8 10   --   --  12  --   CREATININE 1.22 1.20 1.20 1.25* 1.35*  --   --  1.05  --   CALCIUM 9.2  --  9.1 8.9 8.5*  --   --  8.8*  --   MG  --   --   --   --   --  2.0 1.8 1.9 1.9  PHOS  --   --   --   --   --  2.9 2.5 1.8* 2.0*   Recent Labs  Lab 04/02/17 1502  AST 16  ALT 13*  ALKPHOS 61  BILITOT 1.2  PROT 6.4*  ALBUMIN 3.5   Recent Labs  Lab 04/02/17 1502 04/02/17 1523 04/03/17 0722 04/04/17 0610 04/05/17 0412 04/06/17 0433  WBC 6.4  --  7.9 8.9 8.5 8.8  NEUTROABS 4.0  --   --   --   --   --   HGB 14.1 14.3 14.3 14.3 12.4* 13.4  HCT 39.9 42.0 41.7 42.5 37.3* 39.0  MCV 86.0  --  86.5 88.2 87.6 87.2  PLT 140*  --  153 140* 130* 95*   No results for input(s): CKTOTAL, CKMB, CKMBINDEX, TROPONINI in the last 168 hours. No results for input(s): LABPROT, INR in the last 72 hours. No results for  input(s): COLORURINE, LABSPEC, [place], GLUCOSEU, HGBUR, BILIRUBINUR, KETONESUR, PROTEINUR, UROBILINOGEN, NITRITE, LEUKOCYTESUR in the last 72 hours.  Invalid input(s): APPERANCEUR     Component Value Date/Time   CHOL 121 04/03/2017 0722   TRIG 47 04/04/2017 0610   HDL 45 04/03/2017 0722   CHOLHDL 2.7 04/03/2017 0722   VLDL 10 04/03/2017 0722   LDLCALC 66 04/03/2017 0722   Lab Results  Component Value Date   HGBA1C 6.0 (H) 04/03/2017      Component Value Date/Time   LABOPIA NONE DETECTED 04/02/2017 1540   COCAINSCRNUR NONE DETECTED 04/02/2017 1540   LABBENZ POSITIVE (A) 04/02/2017 1540   AMPHETMU NONE DETECTED 04/02/2017 1540   THCU NONE DETECTED 04/02/2017 1540   LABBARB NONE DETECTED 04/02/2017 1540    Recent Labs  Lab 04/02/17 1502  ETH <10    I have personally reviewed the radiological images below and agree with the radiology interpretations.  Ct Angio Head and neck W Or Wo Contrast 04/02/2017 IMPRESSION: No intracranial aneurysm or vascular malformation to account for the hemorrhage in the right thalamus and midbrain. Negative for emergent large vessel occlusion. 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 No significant carotid or vertebral artery stenosis in the neck.   Ct Head Wo Contrast 04/02/2017 IMPRESSION: 1. Stable acute hemorrhage of right thalamus extending into the ventricular system. Stable ventricle size. 2. No interval acute intracranial hemorrhage, infarct, or mass effect. 3. Stable background of chronic microvascular ischemic changes and parenchymal volume loss of the brain.    Ct Head Code Stroke Wo Contrast 04/02/2017 IMPRESSION: 1. Acute hemorrhage in the midbrain with extension at the ventricles. Negative for hydrocephalus. This hemorrhage is most likely due to hypertension, vascular malformation possible 2. Atrophy and moderate chronic microvascular ischemia. 3. Complete pneumatization of the clivus with an  unusual appearance. Question postsurgical defect in the posterior wall of the sphenoid sinus leading to pneumatization of the clivus.   Ct Head Wo Contrast 04/04/2017 IMPRESSION: 1. Interval placement of right frontal approach ventriculostomy catheter with tip in the third ventricle. Slight decrease in size of right lateral ventricle. Stable size of left lateral ventricle. 2. Stable hematoma centered in right thalamus and third ventricles with small volume of hemorrhage in lateral ventricles. 3. Expected postsurgical changes related to right frontal burr hole. 4. No new acute intracranial abnormality identified.   04/03/2017 IMPRESSION: 1. Increase lateral and third ventricle size compatible with developing hydrocephalus. No downward herniation. 2. Stable hemorrhage centered in right thalamus and third ventricle with small volume and lateral ventricles. 3. No new acute hemorrhage, infarct, or focal mass effect identified. 4. Stable chronic microvascular ischemic changes and parenchymal volume loss of the brain.  TTE  - Left ventricle: The cavity size was normal. Systolic function was   vigorous. The estimated ejection fraction was in the range of 65%   to 70%. Wall motion was normal; there were no regional wall   motion abnormalities. Doppler parameters are consistent with   abnormal left ventricular relaxation (grade 1 diastolic   dysfunction). There was no evidence of elevated ventricular   filling pressure by Doppler parameters. - Aortic valve: Trileaflet; mildly thickened, mildly calcified   leaflets. There was no regurgitation. - Mitral valve: There was no regurgitation. - Left atrium: The atrium was normal in size. - Right ventricle: Systolic function was normal. - Right atrium: The atrium was normal in size. - Tricuspid valve: There was mild regurgitation. - Pulmonic valve: There was no regurgitation. - Pulmonary arteries: Systolic pressure was within the normal   range. - Inferior  vena cava: The vessel was normal in size. - Pericardium, extracardiac: There was no pericardial effusion.  EEG  This is an abnormal EEG due to background slowing and disorganization seen throughout the tracing.  This is a non-specific finding that can be seen with toxic, metabolic, diffuse, or multifocal structural processes.  No definite epileptiform changes were noted.  Occasional movements were noted (twitching, jaw tremors), however not epileptiform changes were seen.  A single EEG without epileptiform changes does not exclude the diagnosis of epilepsy. Clinical correlation advised.  Ct Head Wo Contrast 04/05/2017 IMPRESSION: Ventricular drainage catheter in the third ventricle unchanged from the prior study. No new hemorrhage identified. Mild ventricular enlargement unchanged from yesterday.  PHYSICAL EXAM  Temp:  [98.7 F (37.1 C)-100.9 F (38.3 C)] 100.2 F (37.9 C) (01/03 1600) Pulse Rate:  [49-95] 49 (01/03 1900) Resp:  [18-31] 19 (01/03 1900) BP: (96-161)/(54-123) 105/76 (01/03 1900) SpO2:  [96 %-100 %] 99 % (01/03 1900) Weight:  [141 lb 5 oz (64.1 kg)] 141 lb 5 oz (64.1 kg) (01/03 0430)  General - Well nourished, well developed, not in distress  Ophthalmologic - fundi not visualized due to noncooperation.    Cardiovascular - regular rhythm and rate.  Neuro - awake alert, orientated to place, age, self but not to time or people, answer questions appropriately, follows all simple commands. Pupil left 66mm irregular shape pupil, not reactive to light, right pupil 2.25mm, not reactive to light (as per sister, bilateral eye surgeries in the past). Blinking to visual threat bilaterally. Significant left facial droop, eye mid position, b/l gaze palsy, doll's eye sluggish. Tongue in the middle. BUE 4/5 but LUE with mild asterixis and resting tremor. BLE 3+/5 on pain stimulation. DTR 1+ and not cooperative on babinski. Sensation, coordination not cooperative and gait not  tested.   ASSESSMENT/PLAN Antonio Flynn is a 77 y.o. male with history of HTN, HLD, DM, dementia, prostate cancer presented with AMS with left sided weakness and facial droop. CT showed right thalamus and midbrain ICH with IVH, no hydrocephalus. Admitted to ICU.  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   2D Echo EF 65-70%  LDL 66  HgbA1c 6.0  Heparin subq for VTE prophylaxis  DIET - DYS 1 Room service appropriate? Yes; Fluid consistency: Thin   aspirin 325 mg daily prior to admission, now on No antithrombotic due to [place].  Continue amantidine for arousal   Ongoing aggressive stroke risk factor management  Therapy recommendations:  pending  Disposition:  Pending  AKI with hypernatremia   Cre 1.2->1.25->1.35->1.05  Na 143->146->150  Likely dehydration  NS bolus  On IVF  Close monitoring  Obstructive hydrocephalus  CT repeat showed developing hydrocephalus  NSG consulted and EVD placed  Repeat CT stable ventricle size  CT repeat stable  NSG on board with vancomycin prophylaxis  EVD has clamped since yesteray  Dysphagia   Did not pass swallow  On TF via cortrak  Dietitian consult  Speech following  Intermittent tachycardia, resolved  Likely due to hypothalamus involvement  On home metoprolol 50 mg twice daily  Stable now  BUE resting tremor L>R  EEG no seizure  Likely due to subthalamic nucleus and midbrain involvement  On home Lamictal 50 mg twice daily  On home Cogentin  Family denies any regular alcohol drinking - on B1, MVI and FA  Diabetes  HgbA1c  6.0 goal < 7.0  Controlled  Home meds on levemir  CBG monitoring  SSI  Hypertension Stable BP goal < 160 now as repeat CT showed stable ICH off Cleviprex  Start po BP meds with Norvasc, losartan and metoprolol  Long term BP goal normotensive  Hyperlipidemia  Home meds:  lipitor 10   LDL 66, goal < 70  Resume lipitor on discharge  Tobacco abuse  Current smoker  Smoking cessation counseling will be provided  Other Stroke Risk Factors  Advanced age  ETOH use - minimal as per sisters  Other Active Problems  Dementia with behavior disturbance on nemanda and lamictal - resume Aricept and Namenda   Prostate cancer - stable followed by urology  Hospital day # 4  This patient is critically ill due to [place] with IVH, hypertensive emergency, smoker, hypernatremia, AKI and at significant risk of neurological worsening, death form hematoma expansion, stroke, seizure, heart failure. This patient's care requires constant monitoring of vital signs, hemodynamics, respiratory and cardiac monitoring, review of multiple databases, neurological assessment, discussion with family, other specialists and medical decision making of high complexity. I had long discussion with sister at bedside, updated pt current condition, treatment plan and potential prognosis. She expressed understanding and appreciation. I spent 35 minutes of neurocritical care time in the care of this patient.  Rosalin Hawking, MD PhD Stroke Neurology 04/06/2017 8:27 PM    To contact Stroke Continuity provider, please refer to [email or web address] After hours, contact General Neurology

## 2017-04-06 NOTE — Plan of Care (Signed)
Pt currently receiving tube feeds via Coretrak.  Will need to recheck swallowing abilities prior to discharge.  Glenda Chroman RN

## 2017-04-07 LAB — CBC
HCT: 35 % — ABNORMAL LOW (ref 39.0–52.0)
HEMOGLOBIN: 11.5 g/dL — AB (ref 13.0–17.0)
MCH: 28.5 pg (ref 26.0–34.0)
MCHC: 32.9 g/dL (ref 30.0–36.0)
MCV: 86.8 fL (ref 78.0–100.0)
Platelets: 111 10*3/uL — ABNORMAL LOW (ref 150–400)
RBC: 4.03 MIL/uL — AB (ref 4.22–5.81)
RDW: 14.5 % (ref 11.5–15.5)
WBC: 8.8 10*3/uL (ref 4.0–10.5)

## 2017-04-07 LAB — GLUCOSE, CAPILLARY
GLUCOSE-CAPILLARY: 139 mg/dL — AB (ref 65–99)
GLUCOSE-CAPILLARY: 157 mg/dL — AB (ref 65–99)
GLUCOSE-CAPILLARY: 245 mg/dL — AB (ref 65–99)
Glucose-Capillary: 181 mg/dL — ABNORMAL HIGH (ref 65–99)
Glucose-Capillary: 172 mg/dL — ABNORMAL HIGH (ref 65–99)

## 2017-04-07 LAB — BASIC METABOLIC PANEL
Anion gap: 7 (ref 5–15)
BUN: 18 mg/dL (ref 6–20)
CHLORIDE: 121 mmol/L — AB (ref 101–111)
CO2: 22 mmol/L (ref 22–32)
CREATININE: 1.28 mg/dL — AB (ref 0.61–1.24)
Calcium: 8.7 mg/dL — ABNORMAL LOW (ref 8.9–10.3)
GFR calc non Af Amer: 53 mL/min — ABNORMAL LOW (ref >60)
Glucose, Bld: 166 mg/dL — ABNORMAL HIGH (ref 65–99)
POTASSIUM: 3.4 mmol/L — AB (ref 3.5–5.1)
SODIUM: 150 mmol/L — AB (ref 135–145)

## 2017-04-07 MED ORDER — JEVITY 1.2 CAL PO LIQD
1000.0000 mL | ORAL | Status: DC
  Administered 2017-04-07 – 2017-04-08 (×3): 1000 mL
  Filled 2017-04-07 (×4): qty 1000

## 2017-04-07 MED ORDER — FREE WATER
200.0000 mL | Status: DC
  Administered 2017-04-07 – 2017-04-09 (×12): 200 mL

## 2017-04-07 NOTE — Progress Notes (Signed)
Just found this  77 yrs old male patient in bed in room with Bil mitten. during RN report rounding  with TF Jevitty 1.2 via cort ract infusing through the pump at 75 ml/hr with HOB flat.  Pt   apparently was a transfer from 4 NICU.Marland Kitchen No notification given that pt has arrived. Pt HOB elevated at 30 degrees and report given by AM  RN who even not aware pt has arrived to the unit. Initial assessment done. Pt  Verbal follows  simple command but confused.. Tele box 14 SR.rate 68 bpm. RN will continue to monitor pt closely.

## 2017-04-07 NOTE — Progress Notes (Signed)
Physical Therapy Treatment Patient Details Name: Antonio Flynn MRN: [id number] DOB: 1941-01-28 Today's Date: 04/07/2017    History of Present Illness 77 y.o. male admitted from SNF on 04/02/17 with AMS and L facial droop and left sided weakness.  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 Pt with significant PMH ofHTN, DM type I, and dementia.  IVD d/c 04/06/17.    PT Comments    Pt did better today, sitting balance with less left lateral lean/push and was able to stand EOB with two person mod assist and RW.  Transferred OOB to chair this way as well.  Sister in room reporting he was ambulatory, could get himself dressed, used a RW and had an aide to help him bathe.  He was from ALF.  She is agreeable he will likely need SNF at discharge.    Follow Up Recommendations  SNF     Equipment Recommendations  Wheelchair (measurements PT);Wheelchair cushion (measurements PT);Hospital bed    Recommendations for Other Services   NA     Precautions / Restrictions Precautions Precautions: Fall Restrictions Weight Bearing Restrictions: No    Mobility  Bed Mobility Overal bed mobility: Needs Assistance Bed Mobility: Supine to Sit     Supine to sit: +2 for physical assistance;Mod assist     General bed mobility comments: Pt initiating legs over EOB and trunk flexion when asked to sit up, but needs mod assist to complete action of getting to EOB.   Transfers Overall transfer level: Needs assistance Equipment used: Rolling walker (2 wheeled) Transfers: Sit to/from Stand Sit to Stand: +2 physical assistance;Mod assist         General transfer comment: Two person mod assist to stand EOB with RW.  Pt with narrow base, left posterior/lateral lean and difficulty keeping feet up under him, had to sit down and regroup before transfer to chair with RW (mod assist with some pivotal steps around with RW.  Pt with difficulty  letting go of walker once seated.       Modified Rankin (Stroke Patients Only) Modified Rankin (Stroke Patients Only) Pre-Morbid Rankin Score: Moderate disability Modified Rankin: Severe disability     Balance Overall balance assessment: Needs assistance Sitting-balance support: Feet supported;No upper extremity supported Sitting balance-Leahy Scale: Poor Sitting balance - Comments: continues with left lateral lean, but only heavy min to light mod assist to correct at EOB today.  Postural control: Posterior lean;Left lateral lean Standing balance support: Bilateral upper extremity supported Standing balance-Leahy Scale: Poor Standing balance comment: two person mod assist with RW to stand today.  left lateral lean and feet ahead of trunk.                             Cognition Arousal/Alertness: Awake/alert Behavior During Therapy: Restless Overall Cognitive Status: Impaired/Different from baseline Area of Impairment: Attention;Following commands;Problem solving                   Current Attention Level: Sustained   Following Commands: Follows one step commands consistently;Follows one step commands with increased time     Problem Solving: Difficulty sequencing;Requires verbal cues;Requires tactile cues General Comments: Pt's sister present and able to report more about baseline.  Pt difficulty folllowing through on commands, but does attempt when asked.          General Comments General comments (skin integrity, edema, etc.): Sister in room observing  session.  She provided more info re: PLOf.       Pertinent Vitals/Pain Pain Assessment: No/denies pain       Prior Function            PT Goals (current goals can now be found in the care plan section) Acute Rehab PT Goals Patient Stated Goal: none stated Progress towards PT goals: Progressing toward goals    Frequency    Min 3X/week      PT Plan Current plan remains appropriate     Co-evaluation PT/OT/SLP Co-Evaluation/Treatment: Yes Reason for Co-Treatment: Necessary to address cognition/behavior during functional activity;Complexity of the patient's impairments (multi-system involvement);For patient/therapist safety;To address functional/ADL transfers PT goals addressed during session: Mobility/safety with mobility;Balance;Proper use of DME        AM-PAC PT "6 Clicks" Daily Activity  Outcome Measure  Difficulty turning over in bed (including adjusting bedclothes, sheets and blankets)?: Unable Difficulty moving from lying on back to sitting on the side of the bed? : Unable Difficulty sitting down on and standing up from a chair with arms (e.g., wheelchair, bedside commode, etc,.)?: Unable Help needed moving to and from a bed to chair (including a wheelchair)?: A Lot Help needed walking in hospital room?: A Lot Help needed climbing 3-5 steps with a railing? : Total 6 Click Score: 8    End of Session Equipment Utilized During Treatment: Gait belt Activity Tolerance: Patient tolerated treatment well Patient left: in chair;with call bell/phone within reach;with chair alarm set;with family/visitor present   PT Visit Diagnosis: Unsteadiness on feet (R26.81);Muscle weakness (generalized) (M62.81);Difficulty in walking, not elsewhere classified (R26.2)     Time: 8032-1224 PT Time Calculation (min) (ACUTE ONLY): 29 min  Charges:  $Therapeutic Activity: 8-22 mins          Antonio Delira B. [place], Lena, DPT [phone]            04/07/2017, 1:07 PM

## 2017-04-07 NOTE — Progress Notes (Signed)
Pharmacy Antibiotic Note  Antonio Flynn is a 77 y.o. male admitted on 04/02/2017 with right thalamus, midbrain ICH with IVH, now s/p ventricular catheter.  Pharmacy has been consulted for vancomycin prophylaxis dosing - day #4. Drain remains in place, Neurosurgery may remove soon per notes. Tmax/24h 100.9, wbc wnl. SCr back up 1.28 - relatively stable this admit, good UOP.  Plan: Continue vancomycin 1250mg  IV every 24 hours Monitor clinical progress, c/s, renal function F/u LOT, vancomycin trough as indicated - soon if continuing   Height: 5\' 8"  (172.7 cm) Weight: 148 lb 2.4 oz (67.2 kg) IBW/kg (Calculated) : 68.4  Temp (24hrs), Avg:99.3 F (37.4 C), Min:98.4 F (36.9 C), Max:100.9 F (38.3 C)  Recent Labs  Lab 04/03/17 0722 04/04/17 0610 04/05/17 0412 04/06/17 0433 04/07/17 0429  WBC 7.9 8.9 8.5 8.8 8.8  CREATININE 1.20 1.25* 1.35* 1.05 1.28*    Estimated Creatinine Clearance: 47.4 mL/min (A) (by C-G formula based on SCr of 1.28 mg/dL (H)).    Allergies  Allergen Reactions  . Penicillins      Elicia Lamp, PharmD, BCPS Clinical Pharmacist 04/07/2017 10:48 AM

## 2017-04-07 NOTE — Progress Notes (Signed)
Occupational Therapy Treatment Patient Details Name: Antonio Flynn MRN: [id number] DOB: 04/24/1940 Today's Date: 04/07/2017    History of present illness 77 y.o. male admitted from SNF on 04/02/17 with AMS and L facial droop and left sided weakness.  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 Pt with significant PMH ofHTN, DM type I, and dementia.  IVD d/c 04/06/17.   OT comments  Pt with slight improvement in cognition and functional mobility today. Intermittently following one step commands and able to perform grooming task with min assist and verbal cues for initiation and continuation of task. Pt required mod assist +2 for stand pivot transfer today. D/c plan remains appropriate. Will continue to follow acutely.   Follow Up Recommendations  SNF;Supervision/Assistance - 24 hour    Equipment Recommendations  Other (comment)(TBD at next venue)    Recommendations for Other Services      Precautions / Restrictions Precautions Precautions: Fall Restrictions Weight Bearing Restrictions: No       Mobility Bed Mobility Overal bed mobility: Needs Assistance Bed Mobility: Supine to Sit     Supine to sit: +2 for physical assistance;Mod assist     General bed mobility comments: Pt initiating legs over EOB and trunk flexion when asked to sit up, but needs mod assist to complete action of getting to EOB.   Transfers Overall transfer level: Needs assistance Equipment used: Rolling walker (2 wheeled) Transfers: Sit to/from Stand Sit to Stand: +2 physical assistance;Mod assist         General transfer comment: Two person mod assist to stand EOB with RW.  Pt with narrow base, left posterior/lateral lean and difficulty keeping feet up under him, had to sit down and regroup before transfer to chair with RW (mod assist with some pivotal steps around with RW.  Pt with difficulty letting go of walker once seated.      Balance Overall balance assessment: Needs assistance Sitting-balance support: Feet supported;No upper extremity supported Sitting balance-Leahy Scale: Poor Sitting balance - Comments: continues with left lateral lean, but only heavy min to light mod assist to correct at EOB today.  Postural control: Posterior lean;Left lateral lean Standing balance support: Bilateral upper extremity supported Standing balance-Leahy Scale: Poor Standing balance comment: two person mod assist with RW to stand today.  left lateral lean and feet ahead of trunk.                            ADL either performed or assessed with clinical judgement   ADL Overall ADL's : Needs assistance/impaired     Grooming: Minimal assistance;Sitting;Wash/dry face Grooming Details (indicate cue type and reason): Cues for initiation and continuation of activity             Lower Body Dressing: Maximal assistance;Bed level Lower Body Dressing Details (indicate cue type and reason): to don socks Toilet Transfer: Moderate assistance;+2 for physical assistance;Stand-pivot;RW Toilet Transfer Details (indicate cue type and reason): simulated by stand pivot EOB to chair going to L side         Functional mobility during ADLs: Moderate assistance;+2 for physical assistance;Rolling walker       Vision   Vision Assessment?: Vision impaired- to be further tested in functional context Additional Comments: Continues to have L neck turn with increased tone and difficulty with rotation to R side. Pt with difficulty tracking and identifying objects.   Perception  Praxis      Cognition Arousal/Alertness: Awake/alert Behavior During Therapy: Restless Overall Cognitive Status: Impaired/Different from baseline Area of Impairment: Attention;Following commands;Problem solving                   Current Attention Level: Sustained   Following Commands: Follows one step commands consistently;Follows one step  commands with increased time     Problem Solving: Difficulty sequencing;Requires verbal cues;Requires tactile cues General Comments: Pt's sister present and able to report more about baseline.  Pt difficulty folllowing through on commands, but does attempt when asked.         Exercises     Shoulder Instructions       General Comments Sister in room observing session.  She provided more info re: PLOf.     Pertinent Vitals/ Pain       Pain Assessment: No/denies pain  Home Living                                          Prior Functioning/Environment              Frequency  Min 2X/week        Progress Toward Goals  OT Goals(current goals can now be found in the care plan section)  Progress towards OT goals: Progressing toward goals  Acute Rehab OT Goals Patient Stated Goal: none stated OT Goal Formulation: With patient  Plan Discharge plan remains appropriate    Co-evaluation    PT/OT/SLP Co-Evaluation/Treatment: Yes Reason for Co-Treatment: Complexity of the patient's impairments (multi-system involvement);To address functional/ADL transfers;For patient/therapist safety;Necessary to address cognition/behavior during functional activity PT goals addressed during session: Mobility/safety with mobility;Balance;Proper use of DME OT goals addressed during session: ADL's and self-care      AM-PAC PT "6 Clicks" Daily Activity     Outcome Measure   Help from another person eating meals?: Total Help from another person taking care of personal grooming?: A Lot Help from another person toileting, which includes using toliet, bedpan, or urinal?: A Lot Help from another person bathing (including washing, rinsing, drying)?: A Lot Help from another person to put on and taking off regular upper body clothing?: A Lot Help from another person to put on and taking off regular lower body clothing?: A Lot 6 Click Score: 11    End of Session Equipment  Utilized During Treatment: Gait belt;Rolling walker  OT Visit Diagnosis: Unsteadiness on feet (R26.81);Other abnormalities of gait and mobility (R26.89);Muscle weakness (generalized) (M62.81);Other symptoms and signs involving cognitive function   Activity Tolerance Patient tolerated treatment well   Patient Left in chair;with call bell/phone within reach;with chair alarm set;with family/visitor present;with restraints reapplied   Nurse Communication          Time: 1610-9604 OT Time Calculation (min): 27 min  Charges: OT General Charges $OT Visit: 1 Visit OT Treatments $Self Care/Home Management : 8-22 mins  Baani Bober A. Ulice Brilliant, M.S., OTR/L Pager: [place] 04/07/2017, 1:16 PM

## 2017-04-07 NOTE — Progress Notes (Signed)
STROKE TEAM PROGRESS NOTE   SUBJECTIVE (INTERVAL HISTORY) RN is at the bedside. Pt awake alert today, answer questions appropriately. EVD removed last night. Has diarrhea likely due to TF. Pt passed swallow but not eating too well. Continue TF for now. Na and Cre elevated, will increase free water.    OBJECTIVE Temp:  [98.1 F (36.7 C)-100.2 F (37.9 C)] 98.1 F (36.7 C) (01/04 1200) Pulse Rate:  [45-78] 69 (01/04 1200) Cardiac Rhythm: Normal sinus rhythm (01/04 1200) Resp:  [16-31] 25 (01/04 1200) BP: (96-133)/(54-99) 124/62 (01/04 1200) SpO2:  [88 %-100 %] 100 % (01/04 1200) Weight:  [148 lb 2.4 oz (67.2 kg)] 148 lb 2.4 oz (67.2 kg) (01/04 0354)  Recent Labs  Lab 04/06/17 2002 04/06/17 2327 04/07/17 0347 04/07/17 0758 04/07/17 1141  GLUCAP 138* 142* 139* 181* 172*   Recent Labs  Lab 04/03/17 0722 04/04/17 0610 04/05/17 0412 04/05/17 1446 04/05/17 1704 04/06/17 0433 04/06/17 1610 04/07/17 0429  NA 145 146* 146*  --   --  150*  --  150*  K 3.6 3.7 3.3*  --   --  3.6  --  3.4*  CL 113* 114* 118*  --   --  120*  --  121*  CO2 23 23 24   --   --  25  --  22  GLUCOSE 111* 91 98  --   --  147*  --  166*  BUN 6 8 10   --   --  12  --  18  CREATININE 1.20 1.25* 1.35*  --   --  1.05  --  1.28*  CALCIUM 9.1 8.9 8.5*  --   --  8.8*  --  8.7*  MG  --   --   --  2.0 1.8 1.9 1.9  --   PHOS  --   --   --  2.9 2.5 1.8* 2.0*  --    Recent Labs  Lab 04/02/17 1502  AST 16  ALT 13*  ALKPHOS 61  BILITOT 1.2  PROT 6.4*  ALBUMIN 3.5   Recent Labs  Lab 04/02/17 1502  04/03/17 0722 04/04/17 0610 04/05/17 0412 04/06/17 0433 04/07/17 0429  WBC 6.4  --  7.9 8.9 8.5 8.8 8.8  NEUTROABS 4.0  --   --   --   --   --   --   HGB 14.1   < > 14.3 14.3 12.4* 13.4 11.5*  HCT 39.9   < > 41.7 42.5 37.3* 39.0 35.0*  MCV 86.0  --  86.5 88.2 87.6 87.2 86.8  PLT 140*  --  153 140* 130* 95* 111*   < > = values in this interval not displayed.   No results for input(s): CKTOTAL, CKMB,  CKMBINDEX, TROPONINI in the last 168 hours. No results for input(s): LABPROT, INR in the last 72 hours. No results for input(s): COLORURINE, LABSPEC, [place], GLUCOSEU, HGBUR, BILIRUBINUR, KETONESUR, PROTEINUR, UROBILINOGEN, NITRITE, LEUKOCYTESUR in the last 72 hours.  Invalid input(s): APPERANCEUR     Component Value Date/Time   CHOL 121 04/03/2017 0722   TRIG 47 04/04/2017 0610   HDL 45 04/03/2017 0722   CHOLHDL 2.7 04/03/2017 0722   VLDL 10 04/03/2017 0722   LDLCALC 66 04/03/2017 0722   Lab Results  Component Value Date   HGBA1C 6.0 (H) 04/03/2017      Component Value Date/Time   LABOPIA NONE DETECTED 04/02/2017 1540   COCAINSCRNUR NONE DETECTED 04/02/2017 1540   LABBENZ POSITIVE (A) 04/02/2017 1540  AMPHETMU NONE DETECTED 04/02/2017 1540   THCU NONE DETECTED 04/02/2017 1540   LABBARB NONE DETECTED 04/02/2017 1540    Recent Labs  Lab 04/02/17 1502  ETH <10    I have personally reviewed the radiological images below and agree with the radiology interpretations.  Ct Angio Head and neck W Or Wo Contrast 04/02/2017 IMPRESSION: No intracranial aneurysm or vascular malformation to account for the hemorrhage in the right thalamus and midbrain. Negative for emergent large vessel occlusion. 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 No significant carotid or vertebral artery stenosis in the neck.   Ct Head Wo Contrast 04/02/2017 IMPRESSION: 1. Stable acute hemorrhage of right thalamus extending into the ventricular system. Stable ventricle size. 2. No interval acute intracranial hemorrhage, infarct, or mass effect. 3. Stable background of chronic microvascular ischemic changes and parenchymal volume loss of the brain.    Ct Head Code Stroke Wo Contrast 04/02/2017 IMPRESSION: 1. Acute hemorrhage in the midbrain with extension at the ventricles. Negative for hydrocephalus. This hemorrhage is most likely due to hypertension, vascular  malformation possible 2. Atrophy and moderate chronic microvascular ischemia. 3. Complete pneumatization of the clivus with an unusual appearance. Question postsurgical defect in the posterior wall of the sphenoid sinus leading to pneumatization of the clivus.   Ct Head Wo Contrast 04/04/2017 IMPRESSION: 1. Interval placement of right frontal approach ventriculostomy catheter with tip in the third ventricle. Slight decrease in size of right lateral ventricle. Stable size of left lateral ventricle. 2. Stable hematoma centered in right thalamus and third ventricles with small volume of hemorrhage in lateral ventricles. 3. Expected postsurgical changes related to right frontal burr hole. 4. No new acute intracranial abnormality identified.   04/03/2017 IMPRESSION: 1. Increase lateral and third ventricle size compatible with developing hydrocephalus. No downward herniation. 2. Stable hemorrhage centered in right thalamus and third ventricle with small volume and lateral ventricles. 3. No new acute hemorrhage, infarct, or focal mass effect identified. 4. Stable chronic microvascular ischemic changes and parenchymal volume loss of the brain.  TTE  - Left ventricle: The cavity size was normal. Systolic function was   vigorous. The estimated ejection fraction was in the range of 65%   to 70%. Wall motion was normal; there were no regional wall   motion abnormalities. Doppler parameters are consistent with   abnormal left ventricular relaxation (grade 1 diastolic   dysfunction). There was no evidence of elevated ventricular   filling pressure by Doppler parameters. - Aortic valve: Trileaflet; mildly thickened, mildly calcified   leaflets. There was no regurgitation. - Mitral valve: There was no regurgitation. - Left atrium: The atrium was normal in size. - Right ventricle: Systolic function was normal. - Right atrium: The atrium was normal in size. - Tricuspid valve: There was mild regurgitation. -  Pulmonic valve: There was no regurgitation. - Pulmonary arteries: Systolic pressure was within the normal   range. - Inferior vena cava: The vessel was normal in size. - Pericardium, extracardiac: There was no pericardial effusion.  EEG  This is an abnormal EEG due to background slowing and disorganization seen throughout the tracing.  This is a non-specific finding that can be seen with toxic, metabolic, diffuse, or multifocal structural processes.  No definite epileptiform changes were noted.  Occasional movements were noted (twitching, jaw tremors), however not epileptiform changes were seen.  A single EEG without epileptiform changes does not exclude the diagnosis of epilepsy. Clinical correlation advised.  Ct Head Wo Contrast 04/05/2017 IMPRESSION:  Ventricular drainage catheter in the third ventricle unchanged from the prior study. No new hemorrhage identified. Mild ventricular enlargement unchanged from yesterday.    PHYSICAL EXAM  Temp:  [98.1 F (36.7 C)-100.2 F (37.9 C)] 98.1 F (36.7 C) (01/04 1200) Pulse Rate:  [45-78] 69 (01/04 1200) Resp:  [16-31] 25 (01/04 1200) BP: (96-133)/(54-99) 124/62 (01/04 1200) SpO2:  [88 %-100 %] 100 % (01/04 1200) Weight:  [148 lb 2.4 oz (67.2 kg)] 148 lb 2.4 oz (67.2 kg) (01/04 0354)  General - Well nourished, well developed, not in distress  Ophthalmologic - fundi not visualized due to noncooperation.    Cardiovascular - regular rhythm and rate.  Neuro - awake alert, orientated to place, age, self but not to time or people, answer questions appropriately, follows all simple commands. Pupil left 78mm irregular shape pupil, not reactive to light, right pupil 2.32mm, not reactive to light (as per sister, bilateral eye surgeries in the past). Blinking to visual threat bilaterally. Significant left facial droop, eye mid position, b/l gaze palsy, doll's eye sluggish. Tongue in the middle. BUE 4/5 but LUE with mild asterixis and resting tremor. BLE  3+/5 on pain stimulation. DTR 1+ and not cooperative on babinski. Sensation, coordination not cooperative and gait not tested.   ASSESSMENT/PLAN Mr. Gahel Safley is a 77 y.o. male with history of HTN, HLD, DM, dementia, prostate cancer presented with AMS with left sided weakness and facial droop. CT showed right thalamus and midbrain ICH with IVH, no hydrocephalus. Admitted to ICU.  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   2D Echo EF 65-70%  LDL 66  HgbA1c 6.0  Heparin subq for VTE prophylaxis  DIET - DYS 1 Room service appropriate? Yes; Fluid consistency: Thin   aspirin 325 mg daily prior to admission, now on No antithrombotic due to [place].  Continue amantidine for arousal   Ongoing aggressive stroke risk factor management  Therapy recommendations:  pending  Disposition:  Pending  AKI with hypernatremia   Cre 1.2->1.25->1.35->1.05->1.28  Na 143->146->150->150  Likely dehydration  Off IVF and increase free water  Close monitoring  Obstructive hydrocephalus  CT repeat showed developing hydrocephalus  NSG consulted and EVD placed  Repeat CT stable ventricle size  CT repeat stable  NSG on board with vancomycin prophylaxis  EVD has clamped since yesteray  Dysphagia   pass swallow but not eat enough  Continue TF  Speech following  Diarrhea likely due to TF   Intermittent tachycardia, resolved  Likely due to hypothalamus involvement  On home metoprolol 50 mg twice daily  Stable now  BUE resting tremor L>R  EEG no seizure  Likely due to subthalamic nucleus and midbrain  involvement  On home Lamictal 50 mg twice daily  On home Cogentin  Family denies any regular alcohol drinking - on B1, MVI and FA  Diabetes  HgbA1c 6.0 goal < 7.0  Controlled  Home meds on levemir  CBG monitoring  SSI  Hypertension Stable BP goal < 160 now as repeat CT showed stable ICH off Cleviprex  Start po BP meds with Norvasc, losartan and  metoprolol  Long term BP goal normotensive  Hyperlipidemia  Home meds:  lipitor 10   LDL 66, goal < 70  Resume lipitor on discharge  Tobacco abuse  Current smoker  Smoking cessation counseling will be provided  Other Stroke Risk Factors  Advanced age  ETOH use - minimal as per sisters  Other Active Problems  Dementia with behavior disturbance on nemanda and lamictal - resume Aricept and Namenda   Prostate cancer - stable followed by urology  Hospital day # 5  This patient is critically ill due to [place] with IVH, hypertensive emergency, smoker, hypernatremia, AKI and at significant risk of neurological worsening, death form hematoma expansion, stroke, seizure, heart failure. This patient's care requires constant monitoring of vital signs, hemodynamics, respiratory and cardiac monitoring, review of multiple databases, neurological assessment, discussion with family, other specialists and medical decision making of high complexity. I spent 35 minutes of neurocritical care time in the care of this patient.  Rosalin Hawking, MD PhD Stroke Neurology 04/07/2017 12:59 PM    To contact Stroke Continuity provider, please refer to [email or web address] After hours, contact General Neurology

## 2017-04-07 NOTE — Progress Notes (Signed)
Nutrition Follow-up  DOCUMENTATION CODES:   Severe malnutrition in context of chronic illness  INTERVENTION:   Change to nocturnal feeding Jevity 1.2 @ 75 ml/hr x 12 hrs (900 ml/day) 30 ml Prostat TID Provides: 1380 kcal (76% of needs), 94 grams protein (94% of needs), and 729 ml free water.  Total free water: 1929 ml  Encourage PO intake Plan to wean TF as PO intake increases  NUTRITION DIAGNOSIS:   Severe Malnutrition related to chronic illness as evidenced by severe fat depletion, severe muscle depletion. Ongoing.   GOAL:   Patient will meet greater than or equal to 90% of their needs Met.   MONITOR:   TF tolerance, I & O's  ASSESSMENT:   Pt with PMH of HTN, HLD, DM, dementia, prostate cancer admitted with ICH with IVH.   Pt discussed during ICU rounds and with RN.  Pt much more alert this am and ate applesauce. Plan to keep TF for now as pt not eating much. Will adjust to nocturnal to allow for increased appetite.  EVD removed  Free water: 200 ml every 4 hours  Medications reviewed and include: folic acid, thiamine, MVI, senokot-s Labs reviewed: Na 150 (H), K+ 3.4 (L) CBG's: [phone]  Diet Order:  DIET - DYS 1 Room service appropriate? Yes; Fluid consistency: Thin  EDUCATION NEEDS:   No education needs have been identified at this time  Skin:  Skin Assessment: Reviewed RN Assessment  Last BM:  400 ml 1/3 via rectal tube  Height:   Ht Readings from Last 1 Encounters:  04/02/17 5' 8"  (1.727 m)    Weight:   Wt Readings from Last 1 Encounters:  04/07/17 148 lb 2.4 oz (67.2 kg)    Ideal Body Weight:  70 kg  BMI:  Body mass index is 22.53 kg/m.  Estimated Nutritional Needs:   Kcal:  1800-2000  Protein:  100-115 grams  Fluid:  > 1.8 L/day  Maylon Peppers RD, LDN, CNSC [phone] Pager [phone] After Hours Pager

## 2017-04-07 NOTE — NC FL2 (Signed)
[place] LEVEL OF CARE SCREENING TOOL     IDENTIFICATION  Patient Name: Antonio Flynn Birthdate: 1940/08/09 Sex: male Admission Date (Current Location): 04/02/2017  [employer] and [place] Number:  Herbalist and Address:  [facility], [place] [address], [place], [place] [address]      Provider Number: [id number]  Attending Physician Name and Address:  Rosalin Hawking, MD  Relative Name and Phone Number:       Current Level of Care: Hospital Recommended Level of Care: [place] Prior Approval Number:    Date Approved/Denied:   PASRR Number:    Discharge Plan: SNF    Current Diagnoses: Patient Active Problem List   Diagnosis Date Noted  . Dysphagia 04/05/2017  . AKI (acute kidney injury) ([place]) 04/05/2017  . Protein-calorie malnutrition, severe 04/05/2017  . ICH (intracerebral hemorrhage) ([place]) 04/02/2017  . IVH (intraventricular hemorrhage) ([place]) 04/02/2017  . Smoker 04/02/2017  . DM (diabetes mellitus) ([place]) 04/02/2017  . HLD (hyperlipidemia) 04/02/2017  . Dementia 03/06/2013  . Depression   . Dementia with behavioral problem   . Alcohol abuse, in remission   . Tobacco abuse counseling   . Diabetes mellitus, type II, insulin dependent ([place])   . HTN (hypertension)     Orientation RESPIRATION BLADDER Height & Weight     Self  Normal Incontinent, External catheter Weight: 148 lb 2.4 oz (67.2 kg) Height:  5\' 8"  (172.7 cm)  BEHAVIORAL SYMPTOMS/MOOD NEUROLOGICAL BOWEL NUTRITION STATUS      Incontinent Diet(Dysphagia 1 with Thin Liquids)  AMBULATORY STATUS COMMUNICATION OF NEEDS Skin   Extensive Assist Verbally Normal                       Personal Care Assistance Level of Assistance  Bathing, Feeding, Dressing Bathing Assistance: Maximum assistance Feeding assistance: Limited assistance Dressing Assistance: Maximum assistance     Functional Limitations Info  Sight, Hearing, Speech Sight Info:  Impaired Hearing Info: Adequate Speech Info: Adequate    SPECIAL CARE FACTORS FREQUENCY  PT (By licensed PT), OT (By licensed OT), Speech therapy     PT Frequency: 3x week OT Frequency: 2x week     Speech Therapy Frequency: 2x week      Contractures Contractures Info: Not present    Additional Factors Info  Code Status, Allergies, Psychotropic, Insulin Sliding Scale Code Status Info: Full Code Allergies Info: Penicillins Psychotropic Info: Aricept Insulin Sliding Scale Info: Novolog every 4 hours       Current Medications (04/07/2017):  This is the current hospital active medication list Current Facility-Administered Medications  Medication Dose Route Frequency Provider Last Rate Last Dose  .  stroke: mapping our early stages of recovery book   Does not apply Once Rosalin Hawking, MD      . acetaminophen (TYLENOL) tablet 650 mg  650 mg Oral Q4H PRN Rosalin Hawking, MD       Or  . acetaminophen (TYLENOL) solution 650 mg  650 mg Per Tube Q4H PRN Rosalin Hawking, MD   650 mg at 04/06/17 1337   Or  . acetaminophen (TYLENOL) suppository 650 mg  650 mg Rectal Q4H PRN Rosalin Hawking, MD      . amantadine (SYMMETREL) 50 MG/5ML solution 100 mg  100 mg Per Tube BID Rosalin Hawking, MD   100 mg at 04/06/17 2230  . amLODipine (NORVASC) tablet 10 mg  10 mg Per Tube Daily Rosalin Hawking, MD   10 mg at  04/06/17 1100  . benztropine (COGENTIN) tablet 0.5 mg  0.5 mg Per Tube BID Rosalin Hawking, MD   0.5 mg at 04/06/17 2230  . chlorhexidine (PERIDEX) 0.12 % solution 15 mL  15 mL Mouth Rinse BID Rosalin Hawking, MD   15 mL at 04/06/17 2200  . clevidipine (CLEVIPREX) infusion 0.5 mg/mL  0-21 mg/hr Intravenous Continuous Rosalin Hawking, MD   Stopped at 04/04/17 1600  . donepezil (ARICEPT) tablet 10 mg  10 mg Per Tube QHS Rosalin Hawking, MD   10 mg at 04/06/17 2230  . feeding supplement (JEVITY 1.2 CAL) liquid 1,000 mL  1,000 mL Per Tube Continuous Rosalin Hawking, MD 55 mL/hr at 04/07/17 0800 1,000 mL at 04/07/17 0800  . feeding  supplement (PRO-STAT SUGAR FREE 64) liquid 30 mL  30 mL Per Tube TID Rosalin Hawking, MD   30 mL at 04/06/17 2230  . folic acid (FOLVITE) tablet 1 mg  1 mg Per Tube Daily Rosalin Hawking, MD   1 mg at 04/06/17 1100  . free water 100 mL  100 mL Per Tube Q4H Rosalin Hawking, MD   100 mL at 04/07/17 0758  . heparin injection 5,000 Units  5,000 Units Subcutaneous Q8H Rosalin Hawking, MD   5,000 Units at 04/07/17 0636  . insulin aspart (novoLOG) injection 0-9 Units  0-9 Units Subcutaneous Q4H Rosalin Hawking, MD   1 Units at 04/07/17 0400  . lamoTRIgine (LAMICTAL) tablet 50 mg  50 mg Per Tube BID Rosalin Hawking, MD   50 mg at 04/07/17 8469  . losartan (COZAAR) tablet 100 mg  100 mg Per Tube Daily Rosalin Hawking, MD   100 mg at 04/06/17 1059  . MEDLINE mouth rinse  15 mL Mouth Rinse q12n4p Rosalin Hawking, MD   15 mL at 04/06/17 1559  . memantine (NAMENDA XR) 24 hr capsule 14 mg  14 mg Oral Daily Rosalin Hawking, MD   14 mg at 04/06/17 1307  . metoprolol tartrate (LOPRESSOR) tablet 50 mg  50 mg Oral BID Rosalin Hawking, MD   50 mg at 04/06/17 2229  . mirabegron ER (MYRBETRIQ) tablet 50 mg  50 mg Oral [place], Cornelius Moras, MD   50 mg at 04/05/17 1218  . multivitamin (PROSIGHT) tablet 1 tablet  1 tablet Oral Daily Rosalin Hawking, MD   1 tablet at 04/06/17 1057  . ondansetron (ZOFRAN) injection 4 mg  4 mg Intravenous Q6H PRN Rosalin Hawking, MD      . pantoprazole sodium (PROTONIX) 40 mg/20 mL oral suspension 40 mg  40 mg Per Tube Daily Rosalin Hawking, MD   40 mg at 04/06/17 1057  . senna-docusate (Senokot-S) tablet 1 tablet  1 tablet Per Tube BID Rosalin Hawking, MD      . tamsulosin [facility]) capsule 0.4 mg  0.4 mg Oral BID Rosalin Hawking, MD   0.4 mg at 04/06/17 2230  . thiamine (VITAMIN B-1) tablet 100 mg  100 mg Per Tube Daily Rosalin Hawking, MD   100 mg at 04/06/17 1100  . vancomycin (VANCOCIN) 1,250 mg in sodium chloride 0.9 % 250 mL IVPB  1,250 mg Intravenous Q24H Laren Everts, RPH   Stopped at 04/07/17 0200     Discharge Medications: Please see  discharge summary for a list of discharge medications.  Relevant Imaging Results:  Relevant Lab Results:   Additional Information SSN [id number]   Barbette Or, [place]

## 2017-04-07 NOTE — Clinical Social Work Note (Signed)
Clinical Social Work Assessment  Patient Details  Name: Antonio Flynn MRN: [id number] Date of Birth: [birth year]  Date of referral:  04/07/17               Reason for consult:  Facility Placement                Permission sought to share information with:  Family Supports Permission granted to share information::  Yes, Verbal Permission Granted  Name::     [name]  Relationship::  Sister  Contact Information:  [phone] / [phone]  Housing/Transportation Living arrangements for the past 2 months:  Assisted Living Facility([facility]) Source of Information:  Siblings Patient Interpreter Needed:  None Criminal Activity/Legal Involvement Pertinent to Current Situation/Hospitalization:  No - Comment as needed Significant Relationships:  Siblings Lives with:  Facility Resident Do you feel safe going back to the place where you live?  Yes Need for family participation in patient care:  Yes (Comment)  Care giving concerns:  Patient sister verbalized her desires to visit facilities prior to patient discharge.  CSW encouraged sister to begin the process today even without bed offers.  Patient sister agreeable and also plans to ask friends and family of possible recommendations.   Social Worker assessment / plan:  Holiday representative spoke with patient sister, Earlie Server, over the phone to offer support and discuss patient needs at discharge.  Patient sister states that patient is a current resident at [facility], however is in agreement that patient will require a higher level of care at discharge.  Patient sister requests SNF placement in [place].  CSW explained referral process and has initiated referral and will follow up with family for bed offers.  CSW remains available for support and to facilitate patient discharge needs once medically stable.  Employment status:  Retired Forensic scientist:  Medicare PT Recommendations:  [place] /  Referral to community resources:  [place]  Patient/Family's Response to care:  Patient sister verbalized understanding of CSW role and appreciation for support and concern.  Patient sister is in agreement with SNF placement and just requests facility remain in [place] if possible.  Patient/Family's Understanding of and Emotional Response to Diagnosis, Current Treatment, and Prognosis:  Patient family understanding of patient limitations and potential need for long term care at SNF level if not enough improvement is made for return to ALF.  Patient family appropriate and acting in the best interest of the patient.  Emotional Assessment Appearance:  Appears stated age Attitude/Demeanor/Rapport:  Unable to Assess Affect (typically observed):  Unable to Assess Orientation:  Oriented to Self Alcohol / Substance use:  Not Applicable Psych involvement (Current and /or in the community):  No (Comment)  Discharge Needs  Concerns to be addressed:  Discharge Planning Concerns Readmission within the last 30 days:  No Current discharge risk:  None Barriers to Discharge:  Continued Medical Work up  [employer], [place]

## 2017-04-08 ENCOUNTER — Inpatient Hospital Stay (HOSPITAL_COMMUNITY): Payer: Medicare Other

## 2017-04-08 LAB — BASIC METABOLIC PANEL
ANION GAP: 6 (ref 5–15)
BUN: 21 mg/dL — ABNORMAL HIGH (ref 6–20)
CO2: 24 mmol/L (ref 22–32)
CREATININE: 1.24 mg/dL (ref 0.61–1.24)
Calcium: 8.9 mg/dL (ref 8.9–10.3)
Chloride: 118 mmol/L — ABNORMAL HIGH (ref 101–111)
GFR calc non Af Amer: 55 mL/min — ABNORMAL LOW (ref >60)
Glucose, Bld: 178 mg/dL — ABNORMAL HIGH (ref 65–99)
POTASSIUM: 3.1 mmol/L — AB (ref 3.5–5.1)
SODIUM: 148 mmol/L — AB (ref 135–145)

## 2017-04-08 LAB — CBC WITH DIFFERENTIAL/PLATELET
BASOS PCT: 0 %
Basophils Absolute: 0 10*3/uL (ref 0.0–0.1)
EOS ABS: 0 10*3/uL (ref 0.0–0.7)
Eosinophils Relative: 0 %
HCT: 33.2 % — ABNORMAL LOW (ref 39.0–52.0)
Hemoglobin: 10.9 g/dL — ABNORMAL LOW (ref 13.0–17.0)
Lymphocytes Relative: 6 %
Lymphs Abs: 0.8 10*3/uL (ref 0.7–4.0)
MCH: 28.6 pg (ref 26.0–34.0)
MCHC: 32.8 g/dL (ref 30.0–36.0)
MCV: 87.1 fL (ref 78.0–100.0)
MONOS PCT: 10 %
Monocytes Absolute: 1.2 10*3/uL — ABNORMAL HIGH (ref 0.1–1.0)
NEUTROS PCT: 84 %
Neutro Abs: 10.4 10*3/uL — ABNORMAL HIGH (ref 1.7–7.7)
Platelets: 109 10*3/uL — ABNORMAL LOW (ref 150–400)
RBC: 3.81 MIL/uL — ABNORMAL LOW (ref 4.22–5.81)
RDW: 14.5 % (ref 11.5–15.5)
WBC: 12.4 10*3/uL — ABNORMAL HIGH (ref 4.0–10.5)

## 2017-04-08 LAB — GLUCOSE, CAPILLARY
GLUCOSE-CAPILLARY: 170 mg/dL — AB (ref 65–99)
GLUCOSE-CAPILLARY: 183 mg/dL — AB (ref 65–99)
GLUCOSE-CAPILLARY: 209 mg/dL — AB (ref 65–99)
GLUCOSE-CAPILLARY: 245 mg/dL — AB (ref 65–99)
Glucose-Capillary: 171 mg/dL — ABNORMAL HIGH (ref 65–99)
Glucose-Capillary: 158 mg/dL — ABNORMAL HIGH (ref 65–99)
Glucose-Capillary: 186 mg/dL — ABNORMAL HIGH (ref 65–99)

## 2017-04-08 MED ORDER — METRONIDAZOLE IN NACL 5-0.79 MG/ML-% IV SOLN
500.0000 mg | Freq: Three times a day (TID) | INTRAVENOUS | Status: DC
  Administered 2017-04-08 – 2017-04-10 (×6): 500 mg via INTRAVENOUS
  Filled 2017-04-08 (×6): qty 100

## 2017-04-08 MED ORDER — DEXTROSE 5 % IV SOLN
1.0000 g | INTRAVENOUS | Status: DC
  Administered 2017-04-08 – 2017-04-11 (×4): 1 g via INTRAVENOUS
  Filled 2017-04-08 (×4): qty 10

## 2017-04-08 MED ORDER — POTASSIUM CHLORIDE 20 MEQ PO PACK
20.0000 meq | PACK | Freq: Three times a day (TID) | ORAL | Status: DC
  Administered 2017-04-08 (×2): 20 meq via ORAL
  Filled 2017-04-08 (×3): qty 1

## 2017-04-08 NOTE — Clinical Social Work Note (Signed)
CSW spoke with pt's sister Earlie Server via telephone. Pt's sister will be coming to the hospital today. CSW left bed offers on table in pt's room. Pt's sister states she will go visit the facilities and reach back out to the Education officer, museum.     [place], [place]

## 2017-04-08 NOTE — Progress Notes (Signed)
STROKE TEAM PROGRESS NOTE   HISTORY PER H&P (04/02/2017) Antonio Flynn is a 77 y.o. African American male with PMH of HTN, HLD, DM, dementia, prostate cancer presented with AMS with left sided weakness and facial droop.   As per EMS staff, pt lives in SNF, and was at baseline at 9:30am when he went out for smoking. At 12:30pm he was found to have mild AMS with sleepy but not otherwise alarming. Around 2:30pm, pt became progressively altered, difficult arouse with left sided weakness and facial droop. EMS called and pt sent over to [facility] for further evaluation. CT head showed right thalamus, midbrain ICH with IVH.   Pt has hx of smoking and alcohol abuse. He has been followed with psychiatry for dementia with behavior disturbance and currently on nemanda and lamictal. On lipitor for HLD, levemir for DM, and norvasc / HCTZ for HTN. He has remote hx of prostate cancer and following with urology. Has hx of laser eye surgery for cataract in the past in [facility] (sister said both eyes).  LSN: 9:30am tPA Given: No: ICH ICH score = 2     SUBJECTIVE (INTERVAL HISTORY) RN is at the bedside. Pt awake alert today, answer questions appropriately. EVD removed last night. Has diarrhea likely due to TF. Pt passed swallow but not eating too well. Continue TF for now. Na and Cre elevated, will increase free water.    OBJECTIVE Temp:  [99.1 F (37.3 C)-102.5 F (39.2 C)] 99.8 F (37.7 C) (01/05 1352) Pulse Rate:  [46-100] 46 (01/05 1352) Cardiac Rhythm: Sinus bradycardia (01/05 0745) Resp:  [18-32] 18 (01/05 1352) BP: (122-158)/(62-81) 122/69 (01/05 1352) SpO2:  [93 %-100 %] 100 % (01/05 1352) Weight:  [156 lb 4.9 oz (70.9 kg)] 156 lb 4.9 oz (70.9 kg) (01/04 1925)  Recent Labs  Lab 04/08/17 0034 04/08/17 0422 04/08/17 0803 04/08/17 1123 04/08/17 1335  GLUCAP 245* 209* 183* 171* 170*   Recent Labs  Lab 04/04/17 0610 04/05/17 0412 04/05/17 1446 04/05/17 1704 04/06/17 0433  04/06/17 1610 04/07/17 0429 04/08/17 1205  NA 146* 146*  --   --  150*  --  150* 148*  K 3.7 3.3*  --   --  3.6  --  3.4* 3.1*  CL 114* 118*  --   --  120*  --  121* 118*  CO2 23 24  --   --  25  --  22 24  GLUCOSE 91 98  --   --  147*  --  166* 178*  BUN 8 10  --   --  12  --  18 21*  CREATININE 1.25* 1.35*  --   --  1.05  --  1.28* 1.24  CALCIUM 8.9 8.5*  --   --  8.8*  --  8.7* 8.9  MG  --   --  2.0 1.8 1.9 1.9  --   --   PHOS  --   --  2.9 2.5 1.8* 2.0*  --   --    Recent Labs  Lab 04/02/17 1502  AST 16  ALT 13*  ALKPHOS 61  BILITOT 1.2  PROT 6.4*  ALBUMIN 3.5   Recent Labs  Lab 04/02/17 1502  04/04/17 0610 04/05/17 0412 04/06/17 0433 04/07/17 0429 04/08/17 1205  WBC 6.4   < > 8.9 8.5 8.8 8.8 12.4*  NEUTROABS 4.0  --   --   --   --   --  10.4*  HGB 14.1   < > 14.3  12.4* 13.4 11.5* 10.9*  HCT 39.9   < > 42.5 37.3* 39.0 35.0* 33.2*  MCV 86.0   < > 88.2 87.6 87.2 86.8 87.1  PLT 140*   < > 140* 130* 95* 111* 109*   < > = values in this interval not displayed.   No results for input(s): CKTOTAL, CKMB, CKMBINDEX, TROPONINI in the last 168 hours. No results for input(s): LABPROT, INR in the last 72 hours. No results for input(s): COLORURINE, LABSPEC, [place], GLUCOSEU, HGBUR, BILIRUBINUR, KETONESUR, PROTEINUR, UROBILINOGEN, NITRITE, LEUKOCYTESUR in the last 72 hours.  Invalid input(s): APPERANCEUR     Component Value Date/Time   CHOL 121 04/03/2017 0722   TRIG 47 04/04/2017 0610   HDL 45 04/03/2017 0722   CHOLHDL 2.7 04/03/2017 0722   VLDL 10 04/03/2017 0722   LDLCALC 66 04/03/2017 0722   Lab Results  Component Value Date   HGBA1C 6.0 (H) 04/03/2017      Component Value Date/Time   LABOPIA NONE DETECTED 04/02/2017 1540   COCAINSCRNUR NONE DETECTED 04/02/2017 1540   LABBENZ POSITIVE (A) 04/02/2017 1540   AMPHETMU NONE DETECTED 04/02/2017 1540   THCU NONE DETECTED 04/02/2017 1540   LABBARB NONE DETECTED 04/02/2017 1540    Recent Labs  Lab 04/02/17 1502   ETH <10    IMAGING  I have personally reviewed the radiological images below and agree with the radiology interpretations.  Ct Angio Head and neck W Or Wo Contrast 04/02/2017 IMPRESSION:  No intracranial aneurysm or vascular malformation to account for the hemorrhage in the right thalamus and midbrain. Negative for emergent large vessel occlusion. 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 No significant carotid or vertebral artery stenosis in the neck.   Ct Head Wo Contrast 04/02/2017 IMPRESSION:  1. Stable acute hemorrhage of right thalamus extending into the ventricular system.   Stable ventricle size.  2. No interval acute intracranial hemorrhage, infarct, or mass effect.  3. Stable background of chronic microvascular ischemic changes and parenchymal volume loss of the brain.    Ct Head Code Stroke Wo Contrast 04/02/2017 IMPRESSION:  1. Acute hemorrhage in the midbrain with extension at the ventricles. Negative for hydrocephalus. This hemorrhage is most likely due to hypertension, vascular malformation possible  2. Atrophy and moderate chronic microvascular ischemia.  3. Complete pneumatization of the clivus with an unusual appearance. Question postsurgical defect in the posterior wall of the sphenoid sinus leading to pneumatization of the clivus.   Ct Head Wo Contrast 04/04/2017 IMPRESSION:  1. Interval placement of right frontal approach ventriculostomy catheter with tip in the third ventricle. Slight decrease in size of right lateral ventricle. Stable size of left lateral ventricle.  2. Stable hematoma centered in right thalamus and third ventricles with small volume of hemorrhage in lateral ventricles.  3. Expected postsurgical changes related to right frontal burr hole.  4. No new acute intracranial abnormality identified.   Ct Head Wo Contrast 04/03/2017 IMPRESSION:  1. Increase lateral and third ventricle size compatible with  developing hydrocephalus. No downward herniation. 2. Stable hemorrhage centered in right thalamus and third ventricle with small volume and lateral ventricles.  3. No new acute hemorrhage, infarct, or focal mass effect identified.  4. Stable chronic microvascular ischemic changes and parenchymal volume loss of the brain.  Ct Head Wo Contrast 04/05/2017 IMPRESSION:  Ventricular drainage catheter in the third ventricle unchanged from the prior study. No new hemorrhage identified. Mild ventricular enlargement unchanged from yesterday.    TTE  -  Left ventricle: The cavity size was normal. Systolic function was   vigorous. The estimated ejection fraction was in the range of 65%   to 70%. Wall motion was normal; there were no regional wall   motion abnormalities. Doppler parameters are consistent with   abnormal left ventricular relaxation (grade 1 diastolic   dysfunction). There was no evidence of elevated ventricular   filling pressure by Doppler parameters. - Aortic valve: Trileaflet; mildly thickened, mildly calcified   leaflets. There was no regurgitation. - Mitral valve: There was no regurgitation. - Left atrium: The atrium was normal in size. - Right ventricle: Systolic function was normal. - Right atrium: The atrium was normal in size. - Tricuspid valve: There was mild regurgitation. - Pulmonic valve: There was no regurgitation. - Pulmonary arteries: Systolic pressure was within the normal   range. - Inferior vena cava: The vessel was normal in size. - Pericardium, extracardiac: There was no pericardial effusion.  EEG  This is an abnormal EEG due to background slowing and disorganization seen throughout the tracing.  This is a non-specific finding that can be seen with toxic, metabolic, diffuse, or multifocal structural processes.  No definite epileptiform changes were noted.  Occasional movements were noted (twitching, jaw tremors), however not epileptiform changes were seen.  A  single EEG without epileptiform changes does not exclude the diagnosis of epilepsy. Clinical correlation advised.    PHYSICAL EXAM  Temp:  [99.1 F (37.3 C)-102.5 F (39.2 C)] 99.8 F (37.7 C) (01/05 1352) Pulse Rate:  [46-100] 46 (01/05 1352) Resp:  [18-32] 18 (01/05 1352) BP: (122-158)/(62-81) 122/69 (01/05 1352) SpO2:  [93 %-100 %] 100 % (01/05 1352) Weight:  [156 lb 4.9 oz (70.9 kg)] 156 lb 4.9 oz (70.9 kg) (01/04 1925)  Vitals:   04/08/17 1000 04/08/17 1200 04/08/17 1333 04/08/17 1352  BP:    122/69  Pulse:    (!) 46  Resp: (!) 30   18  Temp:  99.9 F (37.7 C) (!) 101.3 F (38.5 C) 99.8 F (37.7 C)  TempSrc:   Axillary Axillary  SpO2:    100%  Weight:      Height:        General - Well nourished, well developed, not in distress  Ophthalmologic - fundi not visualized due to noncooperation.    Cardiovascular - regular rhythm and rate.  Neuro - awake alert, orientated to place, age, self but not to time or people, answer questions appropriately, follows all simple commands. Pupil left 12mm irregular shape pupil, not reactive to light, right pupil 2.31mm, not reactive to light (as per sister, bilateral eye surgeries in the past). Blinking to visual threat bilaterally. Significant left facial droop, eye mid position, b/l gaze palsy, doll's eye sluggish. Tongue in the middle. BUE 4/5 but LUE with mild asterixis and resting tremor. BLE 3+/5 on pain stimulation. DTR 1+ and not cooperative on babinski. Sensation, coordination not cooperative and gait not tested.   ASSESSMENT/PLAN Mr. Taji Barretto is a 77 y.o. male with history of HTN, HLD, DM, dementia, prostate cancer presented with AMS with left sided weakness and facial droop. CT showed right thalamus and midbrain ICH with IVH, no hydrocephalus. Admitted to ICU.  ICH:  right thalamic ICH with IVH, likely secondary to HTN and small vessel disease source  Resultant lethargy, left hemiparesis, gaze palsy  CT head right  thalamic and midbrain ICH with IVH  CT head repeat right thalamic ICH with IVH, no hydrocephalus  CT head repeat 04/03/17 stable  hematoma and IVH but developing hydrocephalus  CT repeat no change  NSG on board s/p EVD 04/04/17  CTA h/n - no aneurysm or AVM.   2D Echo EF 65-70%  LDL 66  HgbA1c 6.0  Heparin subq for VTE prophylaxis  DIET - DYS 1 Room service appropriate? Yes; Fluid consistency: Thin   aspirin 325 mg daily prior to admission, now on No antithrombotic due to National.  Continue amantidine for arousal   Ongoing aggressive stroke risk factor management  Therapy recommendations:  pending  Disposition:  Pending  AKI with hypernatremia   Cre 1.2->1.25->1.35->1.05->1.28  Na 143->146->150->150  Likely dehydration  Off IVF and increase free water  Close monitoring  Obstructive hydrocephalus  CT repeat showed developing hydrocephalus  NSG consulted and EVD placed  Repeat CT stable ventricle size  CT repeat stable  NSG on board with vancomycin prophylaxis  EVD has clamped since yesteray  Dysphagia   Passed swallow but not eating enough  Continue TF  Speech following  Diarrhea likely due to TF   Intermittent tachycardia, resolved  Likely due to hypothalamus involvement  On home metoprolol 50 mg twice daily  Stable now  BUE resting tremor L>R  EEG no seizure  Likely due to subthalamic nucleus and midbrain involvement  On home Lamictal 50 mg twice daily  On home Cogentin  Family denies any regular alcohol drinking - on B1, MVI and FA  Diabetes  HgbA1c 6.0 goal < 7.0  Controlled  Home meds on levemir  CBG monitoring  SSI  Hypertension Stable BP goal < 160 now as repeat CT showed stable ICH off Cleviprex  Start po BP meds with Norvasc, losartan and metoprolol  Long term BP goal normotensive  Hyperlipidemia  Home meds:  lipitor 10   LDL 66, goal < 70  Resume lipitor on discharge  Tobacco abuse  Current  smoker  Smoking cessation counseling will be provided  Other Stroke Risk Factors  Advanced age  ETOH use - minimal as per sisters  Other Active Problems  Dementia with behavior disturbance on nemanda and lamictal - resume Aricept and Namenda   Prostate cancer - stable followed by urology  Fever today 102.5 -> fever workup. (CXR - U/A & culture - CBC w diff - blood cultures) Plan empiric treatment for possible aspiration pneumonia. Await cultures. Stop Flagyl if not needed. Pt has pcn allergy -> rash. Discussed with pharmacist. Madaline Brilliant to use cephalosporin and monitor. Recheck labs tomorrow.   Hypokalemia - supplement - recheck in [facility] day # 6  This patient is critically ill due to [place] with IVH, hypertensive emergency, smoker, hypernatremia, AKI and at significant risk of neurological worsening, death form hematoma expansion, stroke, seizure, heart failure. This patient's care requires constant monitoring of vital signs, hemodynamics, respiratory and cardiac monitoring, review of multiple databases, neurological assessment, discussion with family, other specialists and medical decision making of high complexity. I spent 30 minutes of neurocritical care time in the care of this patient.     To contact Stroke Continuity provider, please refer to [email or web address] After hours, contact General Neurology

## 2017-04-08 NOTE — Progress Notes (Signed)
Pt temp 102.5, RR 30. MD notified.

## 2017-04-09 ENCOUNTER — Encounter (HOSPITAL_COMMUNITY): Payer: Self-pay | Admitting: Radiology

## 2017-04-09 ENCOUNTER — Inpatient Hospital Stay (HOSPITAL_COMMUNITY): Payer: Medicare Other

## 2017-04-09 DIAGNOSIS — J9600 Acute respiratory failure, unspecified whether with hypoxia or hypercapnia: Secondary | ICD-10-CM | POA: Diagnosis present

## 2017-04-09 DIAGNOSIS — E870 Hyperosmolality and hypernatremia: Secondary | ICD-10-CM | POA: Diagnosis not present

## 2017-04-09 LAB — BLOOD GAS, ARTERIAL
Acid-Base Excess: 1.2 mmol/L (ref 0.0–2.0)
Bicarbonate: 24.1 mmol/L (ref 20.0–28.0)
Drawn by: 448981
FIO2: 21
O2 Saturation: 92.7 %
Patient temperature: 100.4
pCO2 arterial: 32.8 mmHg (ref 32.0–48.0)
pH, Arterial: 7.485 — ABNORMAL HIGH (ref 7.350–7.450)
pO2, Arterial: 67.5 mmHg — ABNORMAL LOW (ref 83.0–108.0)

## 2017-04-09 LAB — GLUCOSE, CAPILLARY
GLUCOSE-CAPILLARY: 137 mg/dL — AB (ref 65–99)
GLUCOSE-CAPILLARY: 209 mg/dL — AB (ref 65–99)
Glucose-Capillary: 141 mg/dL — ABNORMAL HIGH (ref 65–99)
Glucose-Capillary: 161 mg/dL — ABNORMAL HIGH (ref 65–99)
Glucose-Capillary: 189 mg/dL — ABNORMAL HIGH (ref 65–99)
Glucose-Capillary: 222 mg/dL — ABNORMAL HIGH (ref 65–99)
Glucose-Capillary: 269 mg/dL — ABNORMAL HIGH (ref 65–99)

## 2017-04-09 LAB — BASIC METABOLIC PANEL WITH GFR
Anion gap: 6 (ref 5–15)
BUN: 26 mg/dL — ABNORMAL HIGH (ref 6–20)
CO2: 25 mmol/L (ref 22–32)
Calcium: 8.8 mg/dL — ABNORMAL LOW (ref 8.9–10.3)
Chloride: 115 mmol/L — ABNORMAL HIGH (ref 101–111)
Creatinine, Ser: 1.21 mg/dL (ref 0.61–1.24)
GFR calc Af Amer: >60 mL/min
GFR calc non Af Amer: 57 mL/min — ABNORMAL LOW
Glucose, Bld: 217 mg/dL — ABNORMAL HIGH (ref 65–99)
Potassium: 3.3 mmol/L — ABNORMAL LOW (ref 3.5–5.1)
Sodium: 146 mmol/L — ABNORMAL HIGH (ref 135–145)

## 2017-04-09 LAB — URINALYSIS, COMPLETE (UACMP) WITH MICROSCOPIC
Bilirubin Urine: NEGATIVE
GLUCOSE, UA: 150 mg/dL — AB
HGB URINE DIPSTICK: NEGATIVE
Ketones, ur: NEGATIVE mg/dL
Leukocytes, UA: NEGATIVE
NITRITE: NEGATIVE
Protein, ur: NEGATIVE mg/dL
Specific Gravity, Urine: 1.015 (ref 1.005–1.030)
pH: 6 (ref 5.0–8.0)

## 2017-04-09 LAB — LACTIC ACID, PLASMA
LACTIC ACID, VENOUS: 1.7 mmol/L (ref 0.5–1.9)
Lactic Acid, Venous: 1.6 mmol/L (ref 0.5–1.9)

## 2017-04-09 LAB — CBC
HCT: 32.9 % — ABNORMAL LOW (ref 39.0–52.0)
Hemoglobin: 10.6 g/dL — ABNORMAL LOW (ref 13.0–17.0)
MCH: 28.1 pg (ref 26.0–34.0)
MCHC: 32.2 g/dL (ref 30.0–36.0)
MCV: 87.3 fL (ref 78.0–100.0)
Platelets: 108 K/uL — ABNORMAL LOW (ref 150–400)
RBC: 3.77 MIL/uL — ABNORMAL LOW (ref 4.22–5.81)
RDW: 14.8 % (ref 11.5–15.5)
WBC: 13.1 K/uL — ABNORMAL HIGH (ref 4.0–10.5)

## 2017-04-09 LAB — TROPONIN I
Troponin I: 0.06 ng/mL (ref <0.03)
Troponin I: 0.06 ng/mL (ref <0.03)

## 2017-04-09 MED ORDER — IOPAMIDOL (ISOVUE-370) INJECTION 76%
INTRAVENOUS | Status: AC
  Administered 2017-04-09: 100 mL
  Filled 2017-04-09: qty 100

## 2017-04-09 MED ORDER — POTASSIUM CHLORIDE 20 MEQ/15ML (10%) PO SOLN
20.0000 meq | Freq: Three times a day (TID) | ORAL | Status: AC
  Administered 2017-04-09 – 2017-04-10 (×4): 20 meq
  Filled 2017-04-09 (×4): qty 15

## 2017-04-09 NOTE — Progress Notes (Signed)
Pt RR 41, Temp 100.4 axillary. Tylenol given, MD notified

## 2017-04-09 NOTE — Progress Notes (Signed)
STROKE TEAM PROGRESS NOTE   HISTORY PER H&P (04/02/2017) Antonio Flynn is a 77 y.o. African American male with PMH of HTN, HLD, DM, dementia, prostate cancer presented with AMS with left sided weakness and facial droop.   As per EMS staff, pt lives in SNF, and was at baseline at 9:30am when he went out for smoking. At 12:30pm he was found to have mild AMS with sleepy but not otherwise alarming. Around 2:30pm, pt became progressively altered, difficult arouse with left sided weakness and facial droop. EMS called and pt sent over to [facility] for further evaluation. CT head showed right thalamus, midbrain ICH with IVH.   Pt has hx of smoking and alcohol abuse. He has been followed with psychiatry for dementia with behavior disturbance and currently on nemanda and lamictal. On lipitor for HLD, levemir for DM, and norvasc / HCTZ for HTN. He has remote hx of prostate cancer and following with urology. Has hx of laser eye surgery for cataract in the past in [facility] (sister said both eyes).  LSN: 9:30am tPA Given: No: ICH ICH score = 2     SUBJECTIVE (INTERVAL HISTORY) RN is at the bedside. Pt awake alert today, answer questions appropriately. EVD removed last night. Has diarrhea likely due to TF. Pt passed swallow but not eating too well. Continue TF for now. Na and Cre elevated, will increase free water.    OBJECTIVE Temp:  [98.5 F (36.9 C)-102.5 F (39.2 C)] 98.9 F (37.2 C) (01/06 0441) Pulse Rate:  [46-73] 51 (01/06 0441) Cardiac Rhythm: Sinus bradycardia (01/06 0701) Resp:  [18-30] 18 (01/06 0441) BP: (111-158)/(55-71) 140/61 (01/06 0441) SpO2:  [97 %-100 %] 97 % (01/06 0441) Weight:  [159 lb 13.3 oz (72.5 kg)] 159 lb 13.3 oz (72.5 kg) (01/06 0441)  Recent Labs  Lab 04/08/17 1601 04/08/17 2015 04/09/17 0043 04/09/17 0440 04/09/17 0722  GLUCAP 158* 186* 189* 222* 209*   Recent Labs  Lab 04/05/17 0412 04/05/17 1446 04/05/17 1704 04/06/17 0433 04/06/17 1610  04/07/17 0429 04/08/17 1205 04/09/17 0300  NA 146*  --   --  150*  --  150* 148* 146*  K 3.3*  --   --  3.6  --  3.4* 3.1* 3.3*  CL 118*  --   --  120*  --  121* 118* 115*  CO2 24  --   --  25  --  22 24 25   GLUCOSE 98  --   --  147*  --  166* 178* 217*  BUN 10  --   --  12  --  18 21* 26*  CREATININE 1.35*  --   --  1.05  --  1.28* 1.24 1.21  CALCIUM 8.5*  --   --  8.8*  --  8.7* 8.9 8.8*  MG  --  2.0 1.8 1.9 1.9  --   --   --   PHOS  --  2.9 2.5 1.8* 2.0*  --   --   --    Recent Labs  Lab 04/02/17 1502  AST 16  ALT 13*  ALKPHOS 61  BILITOT 1.2  PROT 6.4*  ALBUMIN 3.5   Recent Labs  Lab 04/02/17 1502  04/05/17 0412 04/06/17 0433 04/07/17 0429 04/08/17 1205 04/09/17 0300  WBC 6.4   < > 8.5 8.8 8.8 12.4* 13.1*  NEUTROABS 4.0  --   --   --   --  10.4*  --   HGB 14.1   < > 12.4*  13.4 11.5* 10.9* 10.6*  HCT 39.9   < > 37.3* 39.0 35.0* 33.2* 32.9*  MCV 86.0   < > 87.6 87.2 86.8 87.1 87.3  PLT 140*   < > 130* 95* 111* 109* 108*   < > = values in this interval not displayed.   No results for input(s): CKTOTAL, CKMB, CKMBINDEX, TROPONINI in the last 168 hours. No results for input(s): LABPROT, INR in the last 72 hours. No results for input(s): COLORURINE, LABSPEC, Leon, GLUCOSEU, HGBUR, BILIRUBINUR, KETONESUR, PROTEINUR, UROBILINOGEN, NITRITE, LEUKOCYTESUR in the last 72 hours.  Invalid input(s): APPERANCEUR     Component Value Date/Time   CHOL 121 04/03/2017 0722   TRIG 47 04/04/2017 0610   HDL 45 04/03/2017 0722   CHOLHDL 2.7 04/03/2017 0722   VLDL 10 04/03/2017 0722   LDLCALC 66 04/03/2017 0722   Lab Results  Component Value Date   HGBA1C 6.0 (H) 04/03/2017      Component Value Date/Time   LABOPIA NONE DETECTED 04/02/2017 1540   COCAINSCRNUR NONE DETECTED 04/02/2017 1540   LABBENZ POSITIVE (A) 04/02/2017 1540   AMPHETMU NONE DETECTED 04/02/2017 1540   THCU NONE DETECTED 04/02/2017 1540   LABBARB NONE DETECTED 04/02/2017 1540    Recent Labs  Lab  04/02/17 1502  ETH <10    IMAGING  I have personally reviewed the radiological images below and agree with the radiology interpretations.  Ct Angio Head and neck W Or Wo Contrast 04/02/2017 IMPRESSION:  No intracranial aneurysm or vascular malformation to account for the hemorrhage in the right thalamus and midbrain. Negative for emergent large vessel occlusion. 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 No significant carotid or vertebral artery stenosis in the neck.   Ct Head Wo Contrast 04/02/2017 IMPRESSION:  1. Stable acute hemorrhage of right thalamus extending into the ventricular system.   Stable ventricle size.  2. No interval acute intracranial hemorrhage, infarct, or mass effect.  3. Stable background of chronic microvascular ischemic changes and parenchymal volume loss of the brain.    Ct Head Code Stroke Wo Contrast 04/02/2017 IMPRESSION:  1. Acute hemorrhage in the midbrain with extension at the ventricles. Negative for hydrocephalus. This hemorrhage is most likely due to hypertension, vascular malformation possible  2. Atrophy and moderate chronic microvascular ischemia.  3. Complete pneumatization of the clivus with an unusual appearance. Question postsurgical defect in the posterior wall of the sphenoid sinus leading to pneumatization of the clivus.   Ct Head Wo Contrast 04/04/2017 IMPRESSION:  1. Interval placement of right frontal approach ventriculostomy catheter with tip in the third ventricle. Slight decrease in size of right lateral ventricle. Stable size of left lateral ventricle.  2. Stable hematoma centered in right thalamus and third ventricles with small volume of hemorrhage in lateral ventricles.  3. Expected postsurgical changes related to right frontal burr hole.  4. No new acute intracranial abnormality identified.   Ct Head Wo Contrast 04/03/2017 IMPRESSION:  1. Increase lateral and third ventricle size  compatible with developing hydrocephalus. No downward herniation. 2. Stable hemorrhage centered in right thalamus and third ventricle with small volume and lateral ventricles.  3. No new acute hemorrhage, infarct, or focal mass effect identified.  4. Stable chronic microvascular ischemic changes and parenchymal volume loss of the brain.  Ct Head Wo Contrast 04/05/2017 IMPRESSION:  Ventricular drainage catheter in the third ventricle unchanged from the prior study. No new hemorrhage identified. Mild ventricular enlargement unchanged from yesterday.    TTE  -  Left ventricle: The cavity size was normal. Systolic function was   vigorous. The estimated ejection fraction was in the range of 65%   to 70%. Wall motion was normal; there were no regional wall   motion abnormalities. Doppler parameters are consistent with   abnormal left ventricular relaxation (grade 1 diastolic   dysfunction). There was no evidence of elevated ventricular   filling pressure by Doppler parameters. - Aortic valve: Trileaflet; mildly thickened, mildly calcified   leaflets. There was no regurgitation. - Mitral valve: There was no regurgitation. - Left atrium: The atrium was normal in size. - Right ventricle: Systolic function was normal. - Right atrium: The atrium was normal in size. - Tricuspid valve: There was mild regurgitation. - Pulmonic valve: There was no regurgitation. - Pulmonary arteries: Systolic pressure was within the normal   range. - Inferior vena cava: The vessel was normal in size. - Pericardium, extracardiac: There was no pericardial effusion.  EEG  This is an abnormal EEG due to background slowing and disorganization seen throughout the tracing.  This is a non-specific finding that can be seen with toxic, metabolic, diffuse, or multifocal structural processes.  No definite epileptiform changes were noted.  Occasional movements were noted (twitching, jaw tremors), however not epileptiform changes  were seen.  A single EEG without epileptiform changes does not exclude the diagnosis of epilepsy. Clinical correlation advised.    PHYSICAL EXAM  Temp:  [98.5 F (36.9 C)-102.5 F (39.2 C)] 98.9 F (37.2 C) (01/06 0441) Pulse Rate:  [46-73] 51 (01/06 0441) Resp:  [18-30] 18 (01/06 0441) BP: (111-158)/(55-71) 140/61 (01/06 0441) SpO2:  [97 %-100 %] 97 % (01/06 0441) Weight:  [159 lb 13.3 oz (72.5 kg)] 159 lb 13.3 oz (72.5 kg) (01/06 0441)  Vitals:   04/08/17 1800 04/08/17 2019 04/09/17 0046 04/09/17 0441  BP: 126/62 (!) 111/55 121/71 140/61  Pulse: (!) 48 (!) 55 (!) 52 (!) 51  Resp: 18 18 18 18   Temp: 98.5 F (36.9 C) 98.8 F (37.1 C) 98.6 F (37 C) 98.9 F (37.2 C)  TempSrc: Oral Oral Oral Axillary  SpO2: 100% 98% 98% 97%  Weight:    159 lb 13.3 oz (72.5 kg)  Height:        General - Well nourished, well developed, not in distress  Ophthalmologic - fundi not visualized due to noncooperation.    Cardiovascular - regular rhythm and rate.  Neuro - awake alert, orientated to place, age, self but not to time or people, answer questions appropriately, follows all simple commands. Pupil left 63mm irregular shape pupil, not reactive to light, right pupil 2.53mm, not reactive to light (as per sister, bilateral eye surgeries in the past). Blinking to visual threat bilaterally. Significant left facial droop, eye mid position, b/l gaze palsy, doll's eye sluggish. Tongue in the middle. BUE 4/5 but LUE with mild asterixis and resting tremor. BLE 3+/5 on pain stimulation. DTR 1+ and not cooperative on babinski. Sensation, coordination not cooperative and gait not tested.   ASSESSMENT/PLAN [name] is a 77 y.o. male with history of HTN, HLD, DM, dementia, prostate cancer presented with AMS with left sided weakness and facial droop. CT showed right thalamus and midbrain ICH with IVH, no hydrocephalus. Admitted to ICU.  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   2D Echo EF 65-70%  LDL 66  HgbA1c 6.0  Heparin subq for VTE prophylaxis  DIET - DYS 1 Room service appropriate? Yes; Fluid consistency: Thin   aspirin 325 mg daily prior to admission, now on No antithrombotic due to [place].  Continue amantidine for arousal   Ongoing aggressive stroke risk factor management  Therapy recommendations:  pending  Disposition:  Pending  AKI with hypernatremia   Cre 1.2->1.25->1.35->1.05->1.28  Na 143->146->150->150  Likely dehydration  Off IVF and increase free water  Close monitoring  Obstructive hydrocephalus  CT repeat showed developing hydrocephalus  NSG consulted and EVD placed  Repeat CT stable ventricle size  CT repeat stable  NSG on board with vancomycin prophylaxis  EVD has clamped since yesteray  Dysphagia   Passed swallow but not eating enough  Continue TF  Speech following  Diarrhea likely due to TF   Intermittent tachycardia, resolved  Likely due to hypothalamus involvement  On home metoprolol 50 mg twice daily  Stable now  BUE resting tremor L>R  EEG no seizure  Likely due to subthalamic nucleus and midbrain involvement  On home Lamictal 50 mg twice daily  On home Cogentin  Family denies any regular alcohol drinking - on B1, MVI and FA  Diabetes  HgbA1c 6.0 goal < 7.0  Controlled  Home meds on levemir  CBG monitoring  SSI  Hypertension Stable BP goal < 160 now as repeat CT showed stable ICH off Cleviprex  Start po BP meds with Norvasc, losartan and metoprolol  Long term BP goal normotensive  Hyperlipidemia  Home meds:   lipitor 10   LDL 66, goal < 70  Resume lipitor on discharge  Tobacco abuse  Current smoker  Smoking cessation counseling will be provided  Other Stroke Risk Factors  Advanced age  ETOH use - minimal as per sisters  Other Active Problems  Dementia with behavior disturbance on nemanda and lamictal - resume Aricept and Namenda   Prostate cancer - stable followed by urology  Fever today 102.5 -> fever workup. (CXR - U/A & culture - CBC w diff - blood cultures) Plan empiric treatment for possible aspiration pneumonia. Await cultures. Stop Flagyl if not needed. Pt has pcn allergy -> rash. Discussed with pharmacist. Madaline Brilliant to use cephalosporin and monitor. Recheck labs tomorrow.   Hypokalemia - supplement - recheck in [facility] day # 7  This patient is critically ill due to [place] with IVH, hypertensive emergency, smoker, hypernatremia, AKI and at significant risk of neurological worsening, death form hematoma expansion, stroke, seizure, heart failure. This patient's care requires constant monitoring of vital signs, hemodynamics, respiratory and cardiac monitoring, review of multiple databases, neurological assessment, discussion with family, other specialists and medical decision making of high complexity. I spent 30 minutes of neurocritical care time in the care of this patient.     To contact Stroke Continuity provider, please refer to [email or web address] After hours, contact General Neurology

## 2017-04-09 NOTE — Progress Notes (Addendum)
Transferred pt off unit to CT  Report given to [facility]

## 2017-04-09 NOTE — Progress Notes (Addendum)
CT head reveals new parenchymal, subarachnoid and subdural hemorrhages as well as worsening hydrocephalus since removal of ventricular drain.  CT head: IMPRESSION: 1. New right frontal lobe parenchymal hemorrhages, subarachnoid hemorrhage, and bilateral subdural hematomas. 2. Diffuse cerebral sulcal effacement with evidence of developing cerebral edema, greatest in the anterior frontal lobes. 3. Mildly worsened hydrocephalus following ventricular drain removal. 4. Unchanged midbrain hemorrhage. Persistent small volume intraventricular hemorrhage with minimal interval changes as above. 5. 7 mm leftward midline shift.  A/R: Will obtain a STAT Neurosurgery evaluation for possible re-placement of ventricular drain. The new hemorrhages do not appear large enough to warrant hypertonic saline. Heparin sq has been discontinued and SCDs have been ordered. Repeat CT head in 12 hours has been ordered.   Addendum: Discussed with Dr. Vertell Limber from Neurosurgery who has reviewed the scans. He feels that there is little if no enlargement of the ventricles and that a repeat CT in 12 hours should be obtained prior to making a decision regarding replacement of the ventricular drain. Following the discussion with Dr. Vertell Limber, I called patient's family to update them regarding his condition, the new CT findings, the opinion expressed to me by Neurosurgery and the plan for repeat CT in 12 hours, or sooner if he worsens. The patient's family expressed understanding and agreement with the plan. They would like to hold off on palliative care for now despite increasing likelihood of a poor long term prognosis given new hemorrhages.   Electronically signed: Dr. Kerney Elbe

## 2017-04-09 NOTE — Consult Note (Signed)
[facility] Medical Consultation  Antonio Flynn VQM:[id number] DOB: 1940-10-22 DOA: 04/02/2017 PCP: Fanny Bien, MD   Requesting physician: ahearn Date of consultation: 04/09/17 Reason for consultation: acute respiratory failure  Impression/Recommendations Principal Problem:   Acute respiratory failure ([place]) Active Problems:   HTN (hypertension)   Dementia   Acute encephalopathy   IVH (intraventricular hemorrhage) (H[facility])   Smoker   DM (diabetes mellitus) ([place])   HLD (hyperlipidemia)   Dysphagia   AKI (acute kidney injury) ([place])   Protein-calorie malnutrition, severe   Hypernatremia  #1. Acute respiratory failure with hypoxia. Etiology uncertain but patient is certainly at risk for aspiration and/or development of pulmonary embolism. Arterial blood gas with the O2 of 67.5 CO2 32.8. Urinalysis yesterday that yields no infection, chest x-ray today without acute process no infiltrate. Respiratory rate 36. Oxygen saturation level greater than 90% on nasal cannula.  Rocephin and flagyl initiated yesterday. CT angio chest ordered. Hopefully will be negative for PE as not good candidate for anticoagualtion. -Follow blood culture -Follow urine culture -Obtain lactic acid -Nothing by mouth including tube feeding -Follow CT angiogram chest rule -Continue antibiotics  #2. Acute encephalopathy. Patient's baseline is alert and oriented to self only. Etiology unclear but likely related to above. Chart review does indicate patient's level of consciousness has waxed and waned to a certain degree since surgery. Reportedly this morning he ate breakfast.  CT of the head is pending -See #1 -Follow CT of the head results -Cycle troponin -monitor    Chief Complaint: fever, tachypnea, hypoxia, encephalopathy  HPI:  Antonio Flynn is a 77 year old African-American past medical history hypertension hyperlipidemia diabetes dementia prostate cancer noted 7 days ago for ICH. 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 The last several days he's had intermittent lethargy decreased oral intake last night he spiked a temperature to cultures were obtained and antibiotics initiated. Today he became hypoxic with tachypnea and more lethargic. ABGs obtained yielding a pH of 7.48 PCO2 32.8 PO2 67.5. Chest x-ray with no acute cardiopulmonary process. [facility] asked to consult for acute respiratory failure, acute encephalopathy concern for infectious process versus pulmonary embolism.  Review of Systems:  Unable to obtain the review of systems do to encephalopathy  Past Medical History:  Diagnosis Date  . Anxiety   . BPH (benign prostatic hyperplasia)   . Constipation   . Dementia    may have had a stroke  . Depression   . Diabetes mellitus type I ([place])   . Hyperlipidemia   . Hypertension   . Hypocalcemia   . Vitamin D deficiency    Past Surgical History:  Procedure Laterality Date  . CATARACT EXTRACTION W/ INTRAOCULAR LENS IMPLANT     Social History:  reports that he has been smoking cigarettes.  He has a 16.50 pack-year smoking history. he has never used smokeless tobacco. He reports that he does not drink alcohol or use drugs.  Allergies  Allergen Reactions  . Penicillins    Family History  Problem Relation Age of Onset  . Hypertension Sister   . Hypertension Brother   . Hypertension Sister     Prior to Admission medications   Medication Sig Start Date End Date Taking? Authorizing Provider  albuterol (PROVENTIL HFA;VENTOLIN HFA) 108 (90 BASE) MCG/ACT inhaler Inhale 2 puffs into the lungs every 6 (six) hours as needed for wheezing or shortness of breath.    Yes [provider]  amLODipine (NORVASC) 5 MG tablet Take 5  mg by mouth daily.   Yes [provider]  aspirin 325 MG tablet Take 325 mg by mouth daily.   Yes [provider]  atorvastatin (LIPITOR) 10 MG tablet Take 10 mg by mouth  daily.   Yes [provider]  benztropine (COGENTIN) 0.5 MG tablet Take 0.5 mg by mouth 2 (two) times daily.   Yes [provider]  cholecalciferol (VITAMIN D) 1000 UNITS tablet Take 1,000 Units by mouth daily.   Yes [provider]  docusate sodium (COLACE) 100 MG capsule Take 100-200 mg by mouth See admin instructions. 100mg  daily and 200mg  daily as needed for constipation   Yes [provider]  donepezil (ARICEPT) 5 MG tablet Take 5 mg by mouth at bedtime.   Yes [provider]  fluticasone (FLONASE) 50 MCG/ACT nasal spray Place 2 sprays into both nostrils daily as needed for allergies or rhinitis.   Yes [provider]  insulin detemir (LEVEMIR) 100 UNIT/ML injection Inject 8 Units into the skin at bedtime.    Yes [provider]  lamoTRIgine (LAMICTAL) 100 MG tablet Take 0.5 tablets (50 mg total) by mouth 2 (two) times daily. 10/21/15  Yes Merian Capron, MD  Linaclotide 145 MCG CAPS Take 145 mg by mouth every morning.   Yes [provider]  LORazepam (ATIVAN) 1 MG tablet Take 0.5 tablets (0.5 mg total) by mouth 2 (two) times daily as needed. Patient taking differently: Take 1 mg by mouth 2 (two) times daily as needed.  10/21/15  Yes Merian Capron, MD  losartan (COZAAR) 100 MG tablet Take 100 mg by mouth daily.   Yes [provider]  Memantine HCl ER (NAMENDA XR) 21 MG CP24 Take 21 mg by mouth daily.   Yes [provider]  metoprolol tartrate (LOPRESSOR) 100 MG tablet Take 100 mg by mouth every morning.   Yes [provider]  mirabegron ER (MYRBETRIQ) 50 MG TB24 tablet Take 50 mg by mouth every morning.    Yes [provider]  polyethylene glycol (MIRALAX / GLYCOLAX) packet Take 17 g by mouth daily.   Yes [provider]  potassium chloride SA (K-DUR,KLOR-CON) 20 MEQ tablet Take 20 mEq by mouth every morning.   Yes [provider]  risperiDONE (RISPERDAL) 2 MG tablet Take  2 mg by mouth at bedtime.   Yes [provider]  Tamsulosin HCl (FLOMAX) 0.4 MG CAPS Take 0.4 mg by mouth 2 (two) times daily.   Yes [provider]   Physical Exam: Blood pressure 137/64, pulse (!) 57, temperature 99.8 F (37.7 C), temperature source Axillary, resp. rate (!) 36, height 5\' 8"  (1.727 m), weight 72.5 kg (159 lb 13.3 oz), SpO2 98 %. Vitals:   04/09/17 1200 04/09/17 1244  BP:  137/64  Pulse: (!) 56 (!) 57  Resp: (!) 36 (!) 36  Temp: 99.8 F (37.7 C) 99.8 F (37.7 C)  SpO2: 99% 98%     General:  Lying in bed opens eyes to verbal stimuli briefly does not follow commands does not answer questions in no acute distress  Eyes: left pupil irregular right pupil round neither react to light  ENT: Ears clear nose without drainage oropharynx without erythema or exudate  Neck: Supple no JVD full range of motion no lymphadenopathy  Cardiovascular: Regular rate and rhythm no murmur gallop or rub no lower extremity edema  Respiratory: Respirations are shallow rate of 36. Sounds distant but clear. I hear no wheeze no crackles  Abdomen:  Flat soft positive bowel sounds no guarding or rebounding  Skin: Warm and dry no rashes or lesions  Musculoskeletal: Joints without swelling/erythema some mild contracture knees  Psychiatric:   Neurologic: Opens eyes briefly to verbal stimuli does not follow commands no spontaneous movement of limbs withdraws to pain  Labs on Admission:  Basic Metabolic Panel: Recent Labs  Lab 04/05/17 0412 04/05/17 1446 04/05/17 1704 04/06/17 0433 04/06/17 1610 04/07/17 0429 04/08/17 1205 04/09/17 0300  NA 146*  --   --  150*  --  150* 148* 146*  K 3.3*  --   --  3.6  --  3.4* 3.1* 3.3*  CL 118*  --   --  120*  --  121* 118* 115*  CO2 24  --   --  25  --  22 24 25   GLUCOSE 98  --   --  147*  --  166* 178* 217*  BUN 10  --   --  12  --  18 21* 26*  CREATININE 1.35*  --   --  1.05  --  1.28* 1.24 1.21  CALCIUM 8.5*  --   --  8.8*   --  8.7* 8.9 8.8*  MG  --  2.0 1.8 1.9 1.9  --   --   --   PHOS  --  2.9 2.5 1.8* 2.0*  --   --   --    Liver Function Tests: No results for input(s): AST, ALT, ALKPHOS, BILITOT, PROT, ALBUMIN in the last 168 hours. No results for input(s): LIPASE, AMYLASE in the last 168 hours. No results for input(s): AMMONIA in the last 168 hours. CBC: Recent Labs  Lab 04/05/17 0412 04/06/17 0433 04/07/17 0429 04/08/17 1205 04/09/17 0300  WBC 8.5 8.8 8.8 12.4* 13.1*  NEUTROABS  --   --   --  10.4*  --   HGB 12.4* 13.4 11.5* 10.9* 10.6*  HCT 37.3* 39.0 35.0* 33.2* 32.9*  MCV 87.6 87.2 86.8 87.1 87.3  PLT 130* 95* 111* 109* 108*   Cardiac Enzymes: No results for input(s): CKTOTAL, CKMB, CKMBINDEX, TROPONINI in the last 168 hours. BNP: Invalid input(s): POCBNP CBG: Recent Labs  Lab 04/08/17 2015 04/09/17 0043 04/09/17 0440 04/09/17 0722 04/09/17 1117  GLUCAP 186* 189* 222* 209* 269*    Radiological Exams on Admission: Dg Chest Port 1 View  Result Date: 04/09/2017 CLINICAL DATA:  Fever and shortness of breath for 2 days. EXAM: PORTABLE CHEST 1 VIEW COMPARISON:  04/08/2017. FINDINGS: The heart size and mediastinal contours are within normal limits. Both lungs are clear. The visualized skeletal structures are unremarkable. Enteric tube remains in position, terminating below the LEFT hemidiaphragm. IMPRESSION: No active disease.  Similar appearance to priors. Electronically Signed   By: Staci Righter M.D.   On: 04/09/2017 10:10   Dg Chest Port 1 View  Result Date: 04/08/2017 CLINICAL DATA:  Dysphagia.  Fever. EXAM: PORTABLE CHEST 1 VIEW COMPARISON:  02/02/2015 FINDINGS: Grossly unchanged cardiac silhouette and mediastinal contours. Enteric tube terminates below left hemidiaphragm, tip excluded from view. No focal airspace opacities. No pleural effusion or pneumothorax. No evidence of edema. No acute osseus abnormalities. Post lower cervical ACDF, incompletely evaluated. IMPRESSION: 1.  No  acute cardiopulmonary disease. 2. Enteric tube terminates below the left hemidiaphragm. Electronically Signed   By: Sandi Mariscal M.D.   On: 04/08/2017 12:31    EKG: pending Time spent: 60 minutes  [facility]   If 7PM-7AM, please contact night-coverage [email or web address] Password  [email or web address] 04/09/2017, 3:02 PM

## 2017-04-10 ENCOUNTER — Inpatient Hospital Stay (HOSPITAL_COMMUNITY): Payer: Medicare Other

## 2017-04-10 DIAGNOSIS — F0151 Vascular dementia with behavioral disturbance: Secondary | ICD-10-CM

## 2017-04-10 DIAGNOSIS — J9601 Acute respiratory failure with hypoxia: Secondary | ICD-10-CM

## 2017-04-10 DIAGNOSIS — R509 Fever, unspecified: Secondary | ICD-10-CM

## 2017-04-10 DIAGNOSIS — E876 Hypokalemia: Secondary | ICD-10-CM

## 2017-04-10 LAB — GLUCOSE, CAPILLARY
GLUCOSE-CAPILLARY: 148 mg/dL — AB (ref 65–99)
GLUCOSE-CAPILLARY: 163 mg/dL — AB (ref 65–99)
GLUCOSE-CAPILLARY: 179 mg/dL — AB (ref 65–99)
GLUCOSE-CAPILLARY: 190 mg/dL — AB (ref 65–99)
Glucose-Capillary: 136 mg/dL — ABNORMAL HIGH (ref 65–99)
Glucose-Capillary: 131 mg/dL — ABNORMAL HIGH (ref 65–99)

## 2017-04-10 LAB — TROPONIN I: TROPONIN I: 0.03 ng/mL — AB (ref <0.03)

## 2017-04-10 MED ORDER — JEVITY 1.2 CAL PO LIQD
1000.0000 mL | ORAL | Status: DC
  Administered 2017-04-10: 1000 mL
  Filled 2017-04-10 (×3): qty 1000

## 2017-04-10 MED ORDER — PRO-STAT SUGAR FREE PO LIQD
30.0000 mL | Freq: Two times a day (BID) | ORAL | Status: DC
  Administered 2017-04-10 – 2017-04-11 (×2): 30 mL
  Filled 2017-04-10 (×3): qty 30

## 2017-04-10 NOTE — Progress Notes (Signed)
Nutrition Consult / Follow-up  DOCUMENTATION CODES:   Severe malnutrition in context of chronic illness  INTERVENTION:    Jevity 1.2 at 60 ml/h (1440 ml per day)  Pro-stat 30 ml BID  Provides 1928 kcal, 110 gm protein, 1166 ml free water daily  NUTRITION DIAGNOSIS:   Severe Malnutrition related to chronic illness as evidenced by severe fat depletion, severe muscle depletion.  Ongoing  GOAL:   Patient will meet greater than or equal to 90% of their needs  Being addressed with TF  MONITOR:   TF tolerance, I & O's  REASON FOR ASSESSMENT:   Consult Enteral/tube feeding initiation and management  ASSESSMENT:   Pt with PMH of HTN, HLD, DM, dementia, prostate cancer admitted with ICH with IVH.   Discussed patient in ICU rounds and with RN today. Patient has a Cortrak tube in place, tip is post-pyloric. TF has been discontinued. Diet was advanced to dysphagia 1 (pureed) with thin liquids on 1/3; intake was 25% of meals. He was made NPO on 1/6 due to decline in status. Noted guarded prognosis. Family is still deciding on goals of care.   Received MD Consult for TF initiation and management.  Labs and medications reviewed. Sodium 146 (H) on 1/6; potassium 3.3 (L) on 1/6 CBG's: [id number]  Diet Order:  Diet NPO time specified  EDUCATION NEEDS:   No education needs have been identified at this time  Skin:  Skin Assessment: Reviewed RN Assessment  Last BM:  1/6  Height:   Ht Readings from Last 1 Encounters:  04/02/17 5\' 8"  (1.727 m)    Weight:   Wt Readings from Last 1 Encounters:  04/10/17 159 lb 13.3 oz (72.5 kg)    Ideal Body Weight:  70 kg  BMI:  Body mass index is 24.3 kg/m.  Estimated Nutritional Needs:   Kcal:  1800-2000  Protein:  100-115 grams  Fluid:  > 1.8 L/day   Molli Barrows, RD, LDN, [place] Pager [phone] After Hours Pager [phone]

## 2017-04-10 NOTE — Plan of Care (Signed)
  Progressing Education: Knowledge of secondary prevention will improve 04/10/2017 2142 - Progressing by Ernestene Kiel, RN Clinical Measurements: Will remain free from infection 04/10/2017 2142 - Progressing by Ernestene Kiel, RN Diagnostic test results will improve 04/10/2017 2142 - Progressing by Ernestene Kiel, RN Respiratory complications will improve 04/10/2017 2142 - Progressing by Ernestene Kiel, RN Cardiovascular complication will be avoided 04/10/2017 2142 - Progressing by Ernestene Kiel, RN Nutrition: Adequate nutrition will be maintained 04/10/2017 2142 - Progressing by Ernestene Kiel, RN Coping: Level of anxiety will decrease 04/10/2017 2142 - Progressing by Ernestene Kiel, RN Safety: Ability to remain free from injury will improve 04/10/2017 2142 - Progressing by Ernestene Kiel, RN Skin Integrity: Risk for impaired skin integrity will decrease 04/10/2017 2142 - Progressing by Ernestene Kiel, RN

## 2017-04-10 NOTE — Progress Notes (Signed)
PT Cancellation Note  Patient Details Name: Antonio Flynn MRN: [id number] DOB: Aug 17, 1940   Cancelled Treatment:    Reason Eval/Treat Not Completed: Medical issues which prohibited therapy, will follow for appropriateness.   Duncan Dull 04/10/2017, 7:47 AM Alben Deeds, PT DPT  Board Certified Neurologic Specialist [phone]

## 2017-04-10 NOTE — Progress Notes (Signed)
PROGRESS NOTE    Antonio Flynn  HFW:[id number] DOB: [birth year] DOA: 04/02/2017 PCP: Fanny Bien, MD      Brief Narrative:  Mr. Amani is a 77 yo M with dementia, HTN, smoking, diabetes who presented with facial droop acutely, found to have ICH, IVH.  Admitted to ICU.  Ventricular drain placed 1/1, now removed.  Two days ago spiked fever, hypoxia, medicine were asked to consult for evaluation of fever and hypoxia.   Assessment & Plan:  Principal Problem:   Acute respiratory failure (HCC) Active Problems:   HTN (hypertension)   Dementia   Acute encephalopathy   IVH (intraventricular hemorrhage) (HCC)   Smoker   DM (diabetes mellitus) ([place])   HLD (hyperlipidemia)   Dysphagia   AKI (acute kidney injury) ([place])   Protein-calorie malnutrition, severe   Hypernatremia   Stroke Intracranial hemorrhage Intraventricular hemorrhage Subdural hemorrhage -Per Neurology and Neurosurgery   Acute respiratory failure with hypoxia Currently satting well on room air.  There was transient hypoxia the other day as well as tachypnea.  CXR and CTA are negative for pneumonia or PE.  Urine negative for infection.  No other localizing signs of infection.  I suspect his change in status from his new bleeding, not infection.  Started on empiric antibiotics.  Cultures negative so far.  Defer to Neurology, Neurosurgery if intracranial infection should be suspected. -May continue ceftriaxone for now -I will discontinue Flagyl as aspiration was ruled out with CT chest -Follow blood and urine cultures -If cultures negative at 48 hours, and goals of care shift, would be recommended to stop the antibiotics  Acute encephalopathy It appears rather the patient's new respiratory change and encephalopathy are from his new hemorrhages? -Per Neurology, Neurosurgery  Hypertension -Per primary   Diabetes On SSI, better control today.  Hypernatremia -Per primary   Hypokalemia -Per  primary  Anemia Normocytic.  Stable    Subjective: Obtunded, comatose. No new fever per nursing, no more awake. No vomiting, cough, diarrhea.  Objective: Vitals:   04/10/17 0600 04/10/17 0700 04/10/17 0800 04/10/17 0845  BP: (!) 143/77 (!) 147/73 (!) 157/99   Pulse: (!) 50 (!) 58 62   Resp: (!) 28 (!) 32 (!) 29   Temp:    99.1 F (37.3 C)  TempSrc:    Oral  SpO2: 100% 100% 100%   Weight:      Height:        Intake/Output Summary (Last 24 hours) at 04/10/2017 0855 Last data filed at 04/10/2017 0805 Gross per 24 hour  Intake 600 ml  Output 900 ml  Net -300 ml   Filed Weights   04/07/17 1925 04/09/17 0441 04/10/17 0451  Weight: 70.9 kg (156 lb 4.9 oz) 72.5 kg (159 lb 13.3 oz) 72.5 kg (159 lb 13.3 oz)    Examination: General appearance: Elderly adult male, comatose.  HEENT: Ventricular shunt scar on forehead, clean dry. No nasal deformity, discharge, epistaxis.  Lips dry cracked.   Skin: Warm and dry.  No suspicious rashes or lesions. Cardiac: RRR, nl S1-S2, no murmurs appreciated.    Respiratory: Normal respiratory rate and rhythm.  CTAB without rales or wheezes. Abdomen: Abdomen soft.  No TTP. No ascites, distension, hepatosplenomegaly.   Neuro: Obtunded, snoring.  Does not rouse to touch. Psych: Unable to assess.    Data Reviewed: I have personally reviewed following labs and imaging studies:  CBC: Recent Labs  Lab 04/05/17 0412 04/06/17 0433 04/07/17 0429 04/08/17 1205 04/09/17 0300  WBC 8.5 8.8  8.8 12.4* 13.1*  NEUTROABS  --   --   --  10.4*  --   HGB 12.4* 13.4 11.5* 10.9* 10.6*  HCT 37.3* 39.0 35.0* 33.2* 32.9*  MCV 87.6 87.2 86.8 87.1 87.3  PLT 130* 95* 111* 109* 191*   Basic Metabolic Panel: Recent Labs  Lab 04/05/17 0412 04/05/17 1446 04/05/17 1704 04/06/17 0433 04/06/17 1610 04/07/17 0429 04/08/17 1205 04/09/17 0300  NA 146*  --   --  150*  --  150* 148* 146*  K 3.3*  --   --  3.6  --  3.4* 3.1* 3.3*  CL 118*  --   --  120*  --  121*  118* 115*  CO2 24  --   --  25  --  22 24 25   GLUCOSE 98  --   --  147*  --  166* 178* 217*  BUN 10  --   --  12  --  18 21* 26*  CREATININE 1.35*  --   --  1.05  --  1.28* 1.24 1.21  CALCIUM 8.5*  --   --  8.8*  --  8.7* 8.9 8.8*  MG  --  2.0 1.8 1.9 1.9  --   --   --   PHOS  --  2.9 2.5 1.8* 2.0*  --   --   --    GFR: Estimated Creatinine Clearance: 51 mL/min (by C-G formula based on SCr of 1.21 mg/dL). Liver Function Tests: No results for input(s): AST, ALT, ALKPHOS, BILITOT, PROT, ALBUMIN in the last 168 hours. No results for input(s): LIPASE, AMYLASE in the last 168 hours. No results for input(s): AMMONIA in the last 168 hours. Coagulation Profile: No results for input(s): INR, PROTIME in the last 168 hours. Cardiac Enzymes: Recent Labs  Lab 04/09/17 1511 04/09/17 1939 04/10/17 0340  TROPONINI 0.06* 0.06* 0.03*   BNP (last 3 results) No results for input(s): PROBNP in the last 8760 hours. HbA1C: No results for input(s): HGBA1C in the last 72 hours. CBG: Recent Labs  Lab 04/09/17 1637 04/09/17 2020 04/09/17 2351 04/10/17 0359 04/10/17 0842  GLUCAP 141* 161* 137* 148* 163*   Lipid Profile: No results for input(s): CHOL, HDL, LDLCALC, TRIG, CHOLHDL, LDLDIRECT in the last 72 hours. Thyroid Function Tests: No results for input(s): TSH, T4TOTAL, FREET4, T3FREE, THYROIDAB in the last 72 hours. Anemia Panel: No results for input(s): VITAMINB12, FOLATE, FERRITIN, TIBC, IRON, RETICCTPCT in the last 72 hours. Urine analysis:    Component Value Date/Time   COLORURINE YELLOW 04/08/2017 [place] 04/08/2017 1143   LABSPEC 1.015 04/08/2017 1143   PHURINE 6.0 04/08/2017 1143   GLUCOSEU 150 (A) 04/08/2017 1143   HGBUR NEGATIVE 04/08/2017 1143   BILIRUBINUR NEGATIVE 04/08/2017 1143   KETONESUR NEGATIVE 04/08/2017 1143   PROTEINUR NEGATIVE 04/08/2017 1143   UROBILINOGEN 0.2 07/08/2009 1801   NITRITE NEGATIVE 04/08/2017 1143   LEUKOCYTESUR NEGATIVE  04/08/2017 1143   Sepsis Labs: @LABRCNTIP (procalcitonin:4,lacticacidven:4)  ) Recent Results (from the past 240 hour(s))  MRSA PCR Screening     Status: None   Collection Time: 04/02/17  4:55 PM  Result Value Ref Range Status   MRSA by PCR NEGATIVE NEGATIVE Final    Comment:        The GeneXpert MRSA Assay (FDA approved for NASAL specimens only), is one component of a comprehensive MRSA colonization surveillance program. It is not intended to diagnose MRSA infection nor to guide or monitor treatment for MRSA infections.  Culture, blood (Routine X 2) w Reflex to ID Panel     Status: None (Preliminary result)   Collection Time: 04/08/17 12:12 PM  Result Value Ref Range Status   Specimen Description BLOOD RIGHT ANTECUBITAL  Final   Special Requests IN PEDIATRIC BOTTLE Blood Culture adequate volume  Final   Culture NO GROWTH < 24 HOURS  Final   Report Status PENDING  Incomplete  Culture, blood (Routine X 2) w Reflex to ID Panel     Status: None (Preliminary result)   Collection Time: 04/08/17 12:15 PM  Result Value Ref Range Status   Specimen Description BLOOD RIGHT FOREARM  Final   Special Requests   Final    BOTTLES DRAWN AEROBIC ONLY Blood Culture adequate volume   Culture NO GROWTH < 24 HOURS  Final   Report Status PENDING  Incomplete         Radiology Studies: Ct Head Wo Contrast  Result Date: 04/10/2017 CLINICAL DATA:  77 y/o  M; intracranial hemorrhage for follow-up. EXAM: CT HEAD WITHOUT CONTRAST TECHNIQUE: Contiguous axial images were obtained from the base of the skull through the vertex without intravenous contrast. COMPARISON:  04/09/2016, 04/05/2016, 04/04/2016 CT of the head FINDINGS: Brain: Stable right frontal lobe and right thalamus brain parenchymal hemorrhage. Stable right-greater-than-left convexity subdural hematoma with frontal predominance. Stable small volume of intraventricular hemorrhage and prepontine hemorrhage extending into the upper cervical  canal. Stable lateral and third ventricle hydrocephalus likely need aqueductal obstruction from third ventricular/thalamus hematoma. Stable 7 mm right-to-left midline shift. Hypoattenuation in periventricular white matter increased with hydrocephalus compatible with periventricular interstitial edema. No new acute intracranial abnormality identified. Vascular: No hyperdense vessel or unexpected calcification. Skull: Right frontal burr hole and mild edema and right frontal scalp. Sinuses/Orbits: Mild paranasal sinus mucosal thickening. Pneumatization of clivus contiguous with sphenoid sinus. Other: None. IMPRESSION: 1. No new acute intracranial abnormality identified. 2. Stable right greater than left convexity frontal predominant subdural hematoma. Stable right frontal lobe and right thalamus brain parenchymal hematoma. Stable small volume of hemorrhage and ventricles. 3. Stable hydrocephalus, likely aqueductal obstruction from thalamus/third ventricle hematoma. 4. Stable right-to-left midline shift. Electronically Signed   By: Kristine Garbe M.D.   On: 04/10/2017 02:47   Ct Head Wo Contrast  Result Date: 04/09/2017 CLINICAL DATA:  Follow-up intracranial hemorrhage. EXAM: CT HEAD WITHOUT CONTRAST TECHNIQUE: Contiguous axial images were obtained from the base of the skull through the vertex without intravenous contrast. COMPARISON:  04/05/2017 FINDINGS: Brain: 1.9 cm hemorrhage involving the midbrain is unchanged. The ventricular drain has been removed. There are multiple new acute parenchymal hemorrhages in the right frontal lobe, both anteriorly in a parasagittal location as well as in the anterior centrum semiovale along the course of the prior ventricular drain. Individual hemorrhages measure up to 2.4 cm in size. One hemorrhage in the parasagittal right frontal lobe contains a blood-fluid level with prominent surrounding edema, and there is also subarachnoid hemorrhage in this region. There are new  acute subdural hematomas, with relatively thin components extending diffusely over both cerebral convexities and with thicker portions in the anterior interhemispheric fissure bilaterally measuring up to 2 cm in aggregate as well as extending underneath the frontal lobes in the anterior cranial fossa. A small amount of subdural blood extends posteriorly along the falx and along the right tentorium. Lateral and third ventriculomegaly has mildly increased, and there is a small amount of blood in the left lateral ventricle which has slightly increased. Third ventricle blood has decreased. There  is diffuse cerebral sulcal effacement bilaterally which is new, and there is the suggestion of mild loss of gray-white differentiation predominantly in the anterior frontal lobes. Background chronic small vessel ischemic changes are again seen in the cerebral white matter. The basilar cisterns remain patent. Vascular: Calcified atherosclerosis at the skullbase. No hyperdense vessel. Skull: Right frontal burr hole. Sinuses/Orbits: Bilateral cataract extraction. At most minimal paranasal sinus mucosal thickening. Clear mastoid air cells. Other: None. IMPRESSION: 1. New right frontal lobe parenchymal hemorrhages, subarachnoid hemorrhage, and bilateral subdural hematomas. 2. Diffuse cerebral sulcal effacement with evidence of developing cerebral edema, greatest in the anterior frontal lobes. 3. Mildly worsened hydrocephalus following ventricular drain removal. 4. Unchanged midbrain hemorrhage. Persistent small volume intraventricular hemorrhage with minimal interval changes as above. 5. 7 mm leftward midline shift. Critical Value/emergent results were called by telephone at the time of interpretation on 04/09/2017 at 8:33 pm to Dr. Jonathon Bellows, who verbally acknowledged these results. Electronically Signed   By: Logan Bores M.D.   On: 04/09/2017 20:39   Ct Angio Chest Pe W Or Wo Contrast  Result Date: 04/09/2017 CLINICAL DATA:   Respiratory failure with hypoxia. EXAM: CT ANGIOGRAPHY CHEST WITH CONTRAST TECHNIQUE: Multidetector CT imaging of the chest was performed using the standard protocol during bolus administration of intravenous contrast. Multiplanar CT image reconstructions and MIPs were obtained to evaluate the vascular anatomy. CONTRAST:  157mL ISOVUE-370 IOPAMIDOL (ISOVUE-370) INJECTION 76% COMPARISON:  None. FINDINGS: Cardiovascular: The heart size is normal. No pericardial effusion. Atherosclerotic calcification is noted in the wall of the thoracic aorta. No filling defect within the opacified pulmonary arteries to suggest the presence of an acute pulmonary embolus. Mediastinum/Nodes: No mediastinal lymphadenopathy. There is no hilar lymphadenopathy. The esophagus has normal imaging features. Feeding tube visualized but distal tip not included on this study. There is no axillary lymphadenopathy. Lungs/Pleura: Centrilobular emphysema evident. Compressive atelectasis noted lower lobes bilaterally. No focal airspace consolidation. No pulmonary edema. Tiny bilateral pleural effusions. Upper Abdomen: Multiple cysts of varying size are seen in the kidneys bilaterally but cannot be fully characterized. Musculoskeletal: Bone windows reveal no worrisome lytic or sclerotic osseous lesions. Review of the MIP images confirms the above findings. IMPRESSION: 1. No CT evidence for acute pulmonary embolus. No other definite findings to explain the patient's history of respiratory failure with hypoxia. Minimal compressive atelectasis in the lower lungs with tiny bilateral pleural effusions. 2. Centrilobular emphysema. 3. Multiple bilateral cystic lesions in the kidneys, incompletely visualized/characterized. Electronically Signed   By: Misty Stanley M.D.   On: 04/09/2017 19:02   Dg Chest Port 1 View  Result Date: 04/09/2017 CLINICAL DATA:  Fever and shortness of breath for 2 days. EXAM: PORTABLE CHEST 1 VIEW COMPARISON:  04/08/2017. FINDINGS:  The heart size and mediastinal contours are within normal limits. Both lungs are clear. The visualized skeletal structures are unremarkable. Enteric tube remains in position, terminating below the LEFT hemidiaphragm. IMPRESSION: No active disease.  Similar appearance to priors. Electronically Signed   By: Staci Righter M.D.   On: 04/09/2017 10:10   Dg Chest Port 1 View  Result Date: 04/08/2017 CLINICAL DATA:  Dysphagia.  Fever. EXAM: PORTABLE CHEST 1 VIEW COMPARISON:  02/02/2015 FINDINGS: Grossly unchanged cardiac silhouette and mediastinal contours. Enteric tube terminates below left hemidiaphragm, tip excluded from view. No focal airspace opacities. No pleural effusion or pneumothorax. No evidence of edema. No acute osseus abnormalities. Post lower cervical ACDF, incompletely evaluated. IMPRESSION: 1.  No acute cardiopulmonary disease. 2. Enteric tube terminates below the  left hemidiaphragm. Electronically Signed   By: Sandi Mariscal M.D.   On: 04/08/2017 12:31        Scheduled Meds: .  stroke: mapping our early stages of recovery book   Does not apply Once  . amantadine  100 mg Per Tube BID  . benztropine  0.5 mg Per Tube BID  . chlorhexidine  15 mL Mouth Rinse BID  . donepezil  10 mg Per Tube QHS  . folic acid  1 mg Per Tube Daily  . insulin aspart  0-9 Units Subcutaneous Q4H  . lamoTRIgine  50 mg Per Tube BID  . losartan  100 mg Per Tube Daily  . mouth rinse  15 mL Mouth Rinse q12n4p  . memantine  14 mg Oral Daily  . metoprolol tartrate  50 mg Oral BID  . mirabegron ER  50 mg Oral [id number]  . multivitamin  1 tablet Oral Daily  . pantoprazole sodium  40 mg Per Tube Daily  . potassium chloride  20 mEq Per Tube TID  . senna-docusate  1 tablet Per Tube BID  . tamsulosin  0.4 mg Oral BID  . thiamine  100 mg Per Tube Daily   Continuous Infusions: . cefTRIAXone (ROCEPHIN)  IV 1 g (04/09/17 1715)  . metronidazole 500 mg (04/10/17 0805)     LOS: 8 days    Time spent: 25  minutes    Edwin Dada, MD [facility] 04/10/2017, 8:55 AM     Pager [phone] --- please page though AMION:  [email or web address] If 7PM-7AM, please contact night-coverage

## 2017-04-10 NOTE — Progress Notes (Signed)
STROKE TEAM PROGRESS NOTE   HISTORY PER H&P (04/02/2017) Leomar Westberg is a 77 y.o. African American male with PMH of HTN, HLD, DM, dementia, prostate cancer presented with AMS with left sided weakness and facial droop.   As per EMS staff, pt lives in SNF, and was at baseline at 9:30am when he went out for smoking. At 12:30pm he was found to have mild AMS with sleepy but not otherwise alarming. Around 2:30pm, pt became progressively altered, difficult arouse with left sided weakness and facial droop. EMS called and pt sent over to [facility] for further evaluation. CT head showed right thalamus, midbrain ICH with IVH.   Pt has hx of smoking and alcohol abuse. He has been followed with psychiatry for dementia with behavior disturbance and currently on nemanda and lamictal. On lipitor for HLD, levemir for DM, and norvasc / HCTZ for HTN. He has remote hx of prostate cancer and following with urology. Has hx of laser eye surgery for cataract in the past in [facility] (sister said both eyes).  LSN: 9:30am tPA Given: No: ICH ICH score = 2     SUBJECTIVE (INTERVAL HISTORY) RN and 2 sisters at the bedside. Pt  patient had spiked a fever with some hypoxia 2 days ago and was moved to the ICU and started on antibiotics on an empirical basis. He had repeat CT scan of the head this morning which showed increasing right frontal hemorrhage but no significant increase in hydrocephalus despite ventriculostomy removal. Neurosurgery was consulted and recommend conservative medical treatment and repeat CT scan of the head 12 hours later which was done this morning which shows stable right frontal lobe and thalamic parenchymal hemorrhage as well as subdural hematomas and stable intraventricular hemorrhage without significant hydrocephalus. Patient's introital condition remains unchanged and remains lethargic and barely arousable and follows occasional commands. Serum sodium is now corrected to 136. He has a panda tube  placed with tube feeds have not been started yet. Medical hospitalist team is helping with medical management. CT scan of the chest shows no definite evidence of pulmonary embolism or pneumonia but shows atelectasis in the lower lung fields with tiny pleural effusions.  OBJECTIVE Temp:  [98.2 F (36.8 C)-99.5 F (37.5 C)] 98.8 F (37.1 C) (01/07 1132) Pulse Rate:  [46-80] 51 (01/07 1400) Cardiac Rhythm: Normal sinus rhythm (01/07 1200) Resp:  [10-34] 31 (01/07 1400) BP: (141-174)/(72-117) 157/83 (01/07 1400) SpO2:  [95 %-100 %] 100 % (01/07 1400) Weight:  [159 lb 13.3 oz (72.5 kg)] 159 lb 13.3 oz (72.5 kg) (01/07 0451)  Recent Labs  Lab 04/09/17 2020 04/09/17 2351 04/10/17 0359 04/10/17 0842 04/10/17 1131  GLUCAP 161* 137* 148* 163* 136*   Recent Labs  Lab 04/05/17 0412 04/05/17 1446 04/05/17 1704 04/06/17 0433 04/06/17 1610 04/07/17 0429 04/08/17 1205 04/09/17 0300  NA 146*  --   --  150*  --  150* 148* 146*  K 3.3*  --   --  3.6  --  3.4* 3.1* 3.3*  CL 118*  --   --  120*  --  121* 118* 115*  CO2 24  --   --  25  --  22 24 25   GLUCOSE 98  --   --  147*  --  166* 178* 217*  BUN 10  --   --  12  --  18 21* 26*  CREATININE 1.35*  --   --  1.05  --  1.28* 1.24 1.21  CALCIUM 8.5*  --   --  8.8*  --  8.7* 8.9 8.8*  MG  --  2.0 1.8 1.9 1.9  --   --   --   PHOS  --  2.9 2.5 1.8* 2.0*  --   --   --    No results for input(s): AST, ALT, ALKPHOS, BILITOT, PROT, ALBUMIN in the last 168 hours. Recent Labs  Lab 04/05/17 0412 04/06/17 0433 04/07/17 0429 04/08/17 1205 04/09/17 0300  WBC 8.5 8.8 8.8 12.4* 13.1*  NEUTROABS  --   --   --  10.4*  --   HGB 12.4* 13.4 11.5* 10.9* 10.6*  HCT 37.3* 39.0 35.0* 33.2* 32.9*  MCV 87.6 87.2 86.8 87.1 87.3  PLT 130* 95* 111* 109* 108*   Recent Labs  Lab 04/09/17 1511 04/09/17 1939 04/10/17 0340  TROPONINI 0.06* 0.06* 0.03*   No results for input(s): LABPROT, INR in the last 72 hours. Recent Labs    04/08/17 1143  COLORURINE  YELLOW  LABSPEC 1.015  PHURINE 6.0  GLUCOSEU 150*  HGBUR NEGATIVE  BILIRUBINUR NEGATIVE  KETONESUR NEGATIVE  PROTEINUR NEGATIVE  NITRITE NEGATIVE  LEUKOCYTESUR NEGATIVE       Component Value Date/Time   CHOL 121 04/03/2017 0722   TRIG 47 04/04/2017 0610   HDL 45 04/03/2017 0722   CHOLHDL 2.7 04/03/2017 0722   VLDL 10 04/03/2017 0722   LDLCALC 66 04/03/2017 0722   Lab Results  Component Value Date   HGBA1C 6.0 (H) 04/03/2017      Component Value Date/Time   LABOPIA NONE DETECTED 04/02/2017 1540   COCAINSCRNUR NONE DETECTED 04/02/2017 1540   LABBENZ POSITIVE (A) 04/02/2017 1540   AMPHETMU NONE DETECTED 04/02/2017 1540   THCU NONE DETECTED 04/02/2017 1540   LABBARB NONE DETECTED 04/02/2017 1540    No results for input(s): ETH in the last 168 hours.  IMAGING  I have personally reviewed the radiological images below and agree with the radiology interpretations.  Ct Angio Head and neck W Or Wo Contrast 04/02/2017 IMPRESSION:  No intracranial aneurysm or vascular malformation to account for the hemorrhage in the right thalamus and midbrain. Negative for emergent large vessel occlusion. 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 No significant carotid or vertebral artery stenosis in the neck.   Ct Head Wo Contrast 04/02/2017 IMPRESSION:  1. Stable acute hemorrhage of right thalamus extending into the ventricular system.   Stable ventricle size.  2. No interval acute intracranial hemorrhage, infarct, or mass effect.  3. Stable background of chronic microvascular ischemic changes and parenchymal volume loss of the brain.    Ct Head Code Stroke Wo Contrast 04/02/2017 IMPRESSION:  1. Acute hemorrhage in the midbrain with extension at the ventricles. Negative for hydrocephalus. This hemorrhage is most likely due to hypertension, vascular malformation possible  2. Atrophy and moderate chronic microvascular ischemia.  3. Complete  pneumatization of the clivus with an unusual appearance. Question postsurgical defect in the posterior wall of the sphenoid sinus leading to pneumatization of the clivus.   Ct Head Wo Contrast 04/04/2017 IMPRESSION:  1. Interval placement of right frontal approach ventriculostomy catheter with tip in the third ventricle. Slight decrease in size of right lateral ventricle. Stable size of left lateral ventricle.  2. Stable hematoma centered in right thalamus and third ventricles with small volume of hemorrhage in lateral ventricles.  3. Expected postsurgical changes related to right frontal burr hole.  4. No new acute intracranial abnormality identified.   Ct Head Wo Contrast 04/03/2017 IMPRESSION:  1.  Increase lateral and third ventricle size compatible with developing hydrocephalus. No downward herniation. 2. Stable hemorrhage centered in right thalamus and third ventricle with small volume and lateral ventricles.  3. No new acute hemorrhage, infarct, or focal mass effect identified.  4. Stable chronic microvascular ischemic changes and parenchymal volume loss of the brain.  Ct Head Wo Contrast 04/05/2017 IMPRESSION:  Ventricular drainage catheter in the third ventricle unchanged from the prior study. No new hemorrhage identified. Mild ventricular enlargement unchanged from yesterday.    TTE  - Left ventricle: The cavity size was normal. Systolic function was   vigorous. The estimated ejection fraction was in the range of 65%   to 70%. Wall motion was normal; there were no regional wall   motion abnormalities. Doppler parameters are consistent with   abnormal left ventricular relaxation (grade 1 diastolic   dysfunction). There was no evidence of elevated ventricular   filling pressure by Doppler parameters. - Aortic valve: Trileaflet; mildly thickened, mildly calcified   leaflets. There was no regurgitation. - Mitral valve: There was no regurgitation. - Left atrium: The atrium was  normal in size. - Right ventricle: Systolic function was normal. - Right atrium: The atrium was normal in size. - Tricuspid valve: There was mild regurgitation. - Pulmonic valve: There was no regurgitation. - Pulmonary arteries: Systolic pressure was within the normal   range. - Inferior vena cava: The vessel was normal in size. - Pericardium, extracardiac: There was no pericardial effusion.  EEG  This is an abnormal EEG due to background slowing and disorganization seen throughout the tracing.  This is a non-specific finding that can be seen with toxic, metabolic, diffuse, or multifocal structural processes.  No definite epileptiform changes were noted.  Occasional movements were noted (twitching, jaw tremors), however not epileptiform changes were seen.  A single EEG without epileptiform changes does not exclude the diagnosis of epilepsy. Clinical correlation advised.    PHYSICAL EXAM  Temp:  [98.2 F (36.8 C)-99.5 F (37.5 C)] 98.8 F (37.1 C) (01/07 1132) Pulse Rate:  [46-80] 51 (01/07 1400) Resp:  [10-34] 31 (01/07 1400) BP: (141-174)/(72-117) 157/83 (01/07 1400) SpO2:  [95 %-100 %] 100 % (01/07 1400) Weight:  [159 lb 13.3 oz (72.5 kg)] 159 lb 13.3 oz (72.5 kg) (01/07 0451)  Vitals:   04/10/17 1132 04/10/17 1200 04/10/17 1300 04/10/17 1400  BP:  (!) 161/81 (!) 174/83 (!) 157/83  Pulse:  67 62 (!) 51  Resp:  (!) 27 19 (!) 31  Temp: 98.8 F (37.1 C)     TempSrc: Oral     SpO2:  100% 99% 100%  Weight:      Height:        General - Well nourished, well developed, not in distress  Ophthalmologic - fundi not visualized due to noncooperation.    Cardiovascular - regular rhythm and rate.  Neuro - stuporous and barely responsive and opens eyes to sternal rub.  follows   simple commands. Pupil left 53mm irregular shape pupil, not reactive to light, right pupil 2.29mm, not reactive to light (as per sister, bilateral eye surgeries in the past). Blinking to visual threat  bilaterally. Significant left facial droop, eye mid position, b/l gaze palsy, doll's eye sluggish. Tongue in the middle. BUE 4/5 but LUE is weaker with mild asterixis and resting tremor. BLE 3+/5 on pain stimulation. DTR 1+ and not cooperative on babinski. Sensation, coordination not cooperative and gait not tested.   ASSESSMENT/PLAN Mr. Laurie Lovejoy is a 77  y.o. male with history of HTN, HLD, DM, dementia, prostate cancer presented with AMS with left sided weakness and facial droop. CT showed right thalamus and midbrain ICH with IVH, no hydrocephalus. Admitted to ICU.  ICH:  right thalamic ICH with IVH, likely secondary to HTN and new right frontal and bilateral subdural hemorrhage following removal of ventriculostomy 04/08/16  Resultant lethargy, left hemiparesis, gaze palsy  CT head right thalamic and midbrain ICH with IVH  CT head repeat right thalamic ICH with IVH, no hydrocephalus  CT head repeat 04/03/17 stable hematoma and IVH but developing hydrocephalus  CT repeat no change  NSG on board s/p EVD 04/04/17  CTA h/n - no aneurysm or AVM.   2D Echo EF 65-70%  LDL 66  HgbA1c 6.0  Heparin subq for VTE prophylaxis  Diet NPO time specified   aspirin 325 mg daily prior to admission, now on No antithrombotic due to [place].  Continue amantidine for arousal   Ongoing aggressive stroke risk factor management  Therapy recommendations:  pending  Disposition:  Pending  AKI with hypernatremia   Cre 1.2->1.25->1.35->1.05->1.28  Na 143->146->150->150  Likely dehydration  Off IVF and increase free water  Close monitoring  Obstructive hydrocephalus  CT repeat showed developing hydrocephalus  NSG consulted and EVD placed  Repeat CT stable ventricle size  CT repeat stable  NSG on board with vancomycin prophylaxis  EVD has clamped since yesteray  Dysphagia   Passed swallow but not eating enough  Continue TF  Speech following  Diarrhea likely due to TF    Intermittent tachycardia, resolved  Likely due to hypothalamus involvement  On home metoprolol 50 mg twice daily  Stable now  BUE resting tremor L>R  EEG no seizure  Likely due to subthalamic nucleus and midbrain involvement  On home Lamictal 50 mg twice daily  On home Cogentin  Family denies any regular alcohol drinking - on B1, MVI and FA  Diabetes  HgbA1c 6.0 goal < 7.0  Controlled  Home meds on levemir  CBG monitoring  SSI  Hypertension Stable BP goal < 160 now as repeat CT showed stable ICH off Cleviprex  Start po BP meds with Norvasc, losartan and metoprolol  Long term BP goal normotensive  Hyperlipidemia  Home meds:  lipitor 10   LDL 66, goal < 70  Resume lipitor on discharge  Tobacco abuse  Current smoker  Smoking cessation counseling will be provided  Other Stroke Risk Factors  Advanced age  ETOH use - minimal as per sisters  Other Active Problems  Dementia with behavior disturbance on nemanda and lamictal - resume Aricept and Namenda   Prostate cancer - stable followed by urology  Fever today 102.5 -> fever workup. (CXR - U/A & culture - CBC w diff - blood cultures) Plan empiric treatment for possible aspiration pneumonia. Await cultures. Stop Flagyl if not needed. Pt has pcn allergy -> rash. Discussed with pharmacist. Madaline Brilliant to use cephalosporin and monitor. Recheck labs tomorrow.   Hypokalemia - supplement - recheck in AM.     Hospital day # 8 Plan maintain strict blood pressure control. No neurological surgical intervention needed at the present time Long discussion with the patient's sister at the bedside about his prognosis which appears to be quite guarded given increase in intracerebral hemorrhage. He will likely require parenteral nutrition and possibly PEG tube and nursing home placement. I also brought up discussion about DO NOT RESUSCITATE but his Sister is thinking about goals of care and need time  to discuss with other  family members to make a final decision in the next few days This patient is critically ill due to [place] with IVH, hypertensive emergency, smoker, hypernatremia, AKI and at significant risk of neurological worsening, death form hematoma expansion, stroke, seizure, heart failure. This patient's care requires constant monitoring of vital signs, hemodynamics, respiratory and cardiac monitoring, review of multiple databases, neurological assessment, discussion with family, other specialists and medical decision making of high complexity. I spent 50 minutes of neurocritical care time in the care of this patient.  Antony Contras, MD  [facility] [address] [place],  [address]  Phone [phone] Fax [phone]   To contact Stroke Continuity provider, please refer to [email or web address] After hours, contact General Neurology

## 2017-04-10 NOTE — Progress Notes (Signed)
SLP Cancellation Note  Patient Details Name: Antonio Flynn MRN: [id number] DOB: May 23, 1940   Cancelled treatment:       Reason Eval/Treat Not Completed: Medical issues which prohibited therapy will follow for readiness.    Teona Vargus, Katherene Ponto 04/10/2017, 7:44 AM

## 2017-04-11 ENCOUNTER — Inpatient Hospital Stay (HOSPITAL_COMMUNITY): Payer: Medicare Other

## 2017-04-11 DIAGNOSIS — G9340 Encephalopathy, unspecified: Secondary | ICD-10-CM

## 2017-04-11 LAB — GLUCOSE, CAPILLARY
GLUCOSE-CAPILLARY: 190 mg/dL — AB (ref 65–99)
GLUCOSE-CAPILLARY: 253 mg/dL — AB (ref 65–99)
GLUCOSE-CAPILLARY: 127 mg/dL — AB (ref 65–99)
Glucose-Capillary: 218 mg/dL — ABNORMAL HIGH (ref 65–99)

## 2017-04-11 LAB — PHOSPHORUS: Phosphorus: 4.2 mg/dL (ref 2.5–4.6)

## 2017-04-11 LAB — COMPREHENSIVE METABOLIC PANEL
ALT: 26 U/L (ref 17–63)
AST: 32 U/L (ref 15–41)
Albumin: 2.7 g/dL — ABNORMAL LOW (ref 3.5–5.0)
Alkaline Phosphatase: 52 U/L (ref 38–126)
Anion gap: 9 (ref 5–15)
BILIRUBIN TOTAL: 0.8 mg/dL (ref 0.3–1.2)
BUN: 24 mg/dL — AB (ref 6–20)
CALCIUM: 8.9 mg/dL (ref 8.9–10.3)
CO2: 28 mmol/L (ref 22–32)
CREATININE: 1.29 mg/dL — AB (ref 0.61–1.24)
Chloride: 118 mmol/L — ABNORMAL HIGH (ref 101–111)
GFR calc Af Amer: >60 mL/min (ref >60)
GFR, EST NON AFRICAN AMERICAN: 53 mL/min — AB (ref >60)
Glucose, Bld: 204 mg/dL — ABNORMAL HIGH (ref 65–99)
POTASSIUM: 3.6 mmol/L (ref 3.5–5.1)
Sodium: 155 mmol/L — ABNORMAL HIGH (ref 135–145)
TOTAL PROTEIN: 6.5 g/dL (ref 6.5–8.1)

## 2017-04-11 LAB — URINALYSIS, ROUTINE W REFLEX MICROSCOPIC
Bilirubin Urine: NEGATIVE
Glucose, UA: 50 mg/dL — AB
HGB URINE DIPSTICK: NEGATIVE
Ketones, ur: NEGATIVE mg/dL
Leukocytes, UA: NEGATIVE
Nitrite: NEGATIVE
PROTEIN: NEGATIVE mg/dL
Specific Gravity, Urine: 1.014 (ref 1.005–1.030)
pH: 6 (ref 5.0–8.0)

## 2017-04-11 LAB — CBC
HCT: 39.7 % (ref 39.0–52.0)
Hemoglobin: 13.5 g/dL (ref 13.0–17.0)
MCH: 30.5 pg (ref 26.0–34.0)
MCHC: 34 g/dL (ref 30.0–36.0)
MCV: 89.6 fL (ref 78.0–100.0)
PLATELETS: 151 10*3/uL (ref 150–400)
RBC: 4.43 MIL/uL (ref 4.22–5.81)
RDW: 15.6 % — AB (ref 11.5–15.5)
WBC: 9.8 10*3/uL (ref 4.0–10.5)

## 2017-04-11 LAB — URINE CULTURE: Culture: 30000 — AB

## 2017-04-11 LAB — MAGNESIUM: MAGNESIUM: 2.6 mg/dL — AB (ref 1.7–2.4)

## 2017-04-11 MED ORDER — BISACODYL 10 MG RE SUPP
10.0000 mg | Freq: Once | RECTAL | Status: AC
  Administered 2017-04-11: 10 mg via RECTAL
  Filled 2017-04-11: qty 1

## 2017-04-11 MED ORDER — LABETALOL HCL 5 MG/ML IV SOLN: 5.0000 mg | INTRAVENOUS | Status: DC | PRN

## 2017-04-11 MED ORDER — PANCRELIPASE (LIP-PROT-AMYL) 12000-38000 UNITS PO CPEP
24000.0000 [IU] | ORAL_CAPSULE | Freq: Once | ORAL | Status: AC
  Administered 2017-04-11: 24000 [IU] via ORAL
  Filled 2017-04-11: qty 2

## 2017-04-11 MED ORDER — KCL-LACTATED RINGERS 20 MEQ/L IV SOLN
INTRAVENOUS | Status: DC
  Administered 2017-04-11: 15:00:00 via INTRAVENOUS
  Filled 2017-04-11: qty 1000

## 2017-04-11 MED ORDER — MORPHINE SULFATE (PF) 2 MG/ML IV SOLN
1.0000 mg | INTRAVENOUS | Status: DC | PRN
  Administered 2017-04-13: 1 mg via INTRAVENOUS
  Filled 2017-04-11: qty 1

## 2017-04-11 MED ORDER — SODIUM BICARBONATE 650 MG PO TABS
650.0000 mg | ORAL_TABLET | Freq: Once | ORAL | Status: AC
  Administered 2017-04-11: 650 mg via ORAL
  Filled 2017-04-11: qty 1

## 2017-04-11 NOTE — Progress Notes (Signed)
CSW spoke with pt's sister Earlie Server and confirmed that family is no longer wanting SNF placement for pt and would prefer Hospice at this time. CSW was made aware by Earlie Server that they do not have a set facility for hospice at this time, but wanted more resources so that pt's sister could tour facilities. Pt's sister plans to follow up with CSW when facility has been chosen. CSW remains available for support and needs at this time.     Virgie Dad Alexandar Weisenberger, MSW, [place] [facility] Clinical Social Worker [phone]

## 2017-04-11 NOTE — Progress Notes (Signed)
CSW spoke with Amy from [facility] and was informed that family has chosen this facility for pt. At this time Amy expressed that [facility] has no beds and that pt would need to be made a full DNR before admission into [facility]. CSW will continue to assist with further needs as presented.    Virgie Dad Tannon Peerson, MSW, [place] [facility] Clinical Social Worker [phone]

## 2017-04-11 NOTE — Progress Notes (Signed)
PT Cancellation Note  Patient Details Name: Antonio Flynn MRN: [id number] DOB: [birth year]   Cancelled Treatment:     Per LCSW note, plan for hospice instead of SNF at this time.   Duncan Dull 04/11/2017, 3:03 PM

## 2017-04-11 NOTE — Progress Notes (Signed)
[facility] Hospitalist Consult Follow up Note  Acute respiratory failure with hypoxia Fever Enterobacter bacteriuria Blood cultures negative.  Repeat UA and CXR normal.  Pneumonia has been ruled out.  No other localizing source of infection except low concentration positive urine culture from three days ago.   -Agree with ceftriaxone, would favor short course (5d) or discontinuing now -Will sign off for now -If questions arise, or goals of care shift, such that the patient would be better cared for on the Hospitalist service, please feel free to reach back out to [place], through the admissions Pager [phone]

## 2017-04-11 NOTE — Progress Notes (Signed)
STROKE TEAM PROGRESS NOTE   HISTORY PER H&P (04/02/2017) Antonio Flynn is a 77 y.o. African American male with PMH of HTN, HLD, DM, dementia, prostate cancer presented with AMS with left sided weakness and facial droop.   As per EMS staff, pt lives in SNF, and was at baseline at 9:30am when he went out for smoking. At 12:30pm he was found to have mild AMS with sleepy but not otherwise alarming. Around 2:30pm, pt became progressively altered, difficult arouse with left sided weakness and facial droop. EMS called and pt sent over to [facility] for further evaluation. CT head showed right thalamus, midbrain ICH with IVH.   Pt has hx of smoking and alcohol abuse. He has been followed with psychiatry for dementia with behavior disturbance and currently on nemanda and lamictal. On lipitor for HLD, levemir for DM, and norvasc / HCTZ for HTN. He has remote hx of prostate cancer and following with urology. Has hx of laser eye surgery for cataract in the past in [facility] (sister said both eyes).  LSN: 9:30am tPA Given: No: ICH ICH score = 2  SUBJECTIVE (INTERVAL HISTORY) RN and 1 sister are  at the bedside. Neuro exam unchanged. Fever trending down, no leukocytosis on latest CBC. U/A Negative today. Family deciding on possible Hospice and changing Code Status to DNR. Awaiting sister that is POA. Dr Leonie Man to discuss with her later today. Per nurse feeding tube not working effectively this afternoon, will attempt to flush.  OBJECTIVE Temp:  [98.2 F (36.8 C)-100.7 F (38.2 C)] 99.1 F (37.3 C) (01/08 1120) Pulse Rate:  [51-109] 71 (01/08 1300) Cardiac Rhythm: Normal sinus rhythm (01/08 1200) Resp:  [23-43] 28 (01/08 1300) BP: (132-183)/(83-119) 153/91 (01/08 1300) SpO2:  [96 %-100 %] 96 % (01/08 1300) Weight:  [64.8 kg (142 lb 13.7 oz)] 64.8 kg (142 lb 13.7 oz) (01/08 0314)  Recent Labs  Lab 04/10/17 1941 04/10/17 2324 04/11/17 0312 04/11/17 0729 04/11/17 1118  GLUCAP 179* 190* 190* 218* 253*    Recent Labs  Lab 04/05/17 1446 04/05/17 1704 04/06/17 0433 04/06/17 1610 04/07/17 0429 04/08/17 1205 04/09/17 0300 04/11/17 1143  NA  --   --  150*  --  150* 148* 146* 155*  K  --   --  3.6  --  3.4* 3.1* 3.3* 3.6  CL  --   --  120*  --  121* 118* 115* 118*  CO2  --   --  25  --  22 24 25 28   GLUCOSE  --   --  147*  --  166* 178* 217* 204*  BUN  --   --  12  --  18 21* 26* 24*  CREATININE  --   --  1.05  --  1.28* 1.24 1.21 1.29*  CALCIUM  --   --  8.8*  --  8.7* 8.9 8.8* 8.9  MG 2.0 1.8 1.9 1.9  --   --   --  2.6*  PHOS 2.9 2.5 1.8* 2.0*  --   --   --  4.2   Recent Labs  Lab 04/11/17 1143  AST 32  ALT 26  ALKPHOS 52  BILITOT 0.8  PROT 6.5  ALBUMIN 2.7*   Recent Labs  Lab 04/06/17 0433 04/07/17 0429 04/08/17 1205 04/09/17 0300 04/11/17 1143  WBC 8.8 8.8 12.4* 13.1* 9.8  NEUTROABS  --   --  10.4*  --   --   HGB 13.4 11.5* 10.9* 10.6* 13.5  HCT 39.0  35.0* 33.2* 32.9* 39.7  MCV 87.2 86.8 87.1 87.3 89.6  PLT 95* 111* 109* 108* 151   Recent Labs  Lab 04/09/17 1511 04/09/17 1939 04/10/17 0340  TROPONINI 0.06* 0.06* 0.03*   No results for input(s): LABPROT, INR in the last 72 hours. Recent Labs    04/11/17 1153  COLORURINE YELLOW  LABSPEC 1.014  PHURINE 6.0  GLUCOSEU 50*  HGBUR NEGATIVE  BILIRUBINUR NEGATIVE  KETONESUR NEGATIVE  PROTEINUR NEGATIVE  NITRITE NEGATIVE  LEUKOCYTESUR NEGATIVE       Component Value Date/Time   CHOL 121 04/03/2017 0722   TRIG 47 04/04/2017 0610   HDL 45 04/03/2017 0722   CHOLHDL 2.7 04/03/2017 0722   VLDL 10 04/03/2017 0722   LDLCALC 66 04/03/2017 0722   Lab Results  Component Value Date   HGBA1C 6.0 (H) 04/03/2017      Component Value Date/Time   LABOPIA NONE DETECTED 04/02/2017 1540   COCAINSCRNUR NONE DETECTED 04/02/2017 1540   LABBENZ POSITIVE (A) 04/02/2017 1540   AMPHETMU NONE DETECTED 04/02/2017 1540   THCU NONE DETECTED 04/02/2017 1540   LABBARB NONE DETECTED 04/02/2017 1540    No results for  input(s): ETH in the last 168 hours.  IMAGING  I have personally reviewed the radiological images below and agree with the radiology interpretations.  Ct Angio Head and neck W Or Wo Contrast 04/02/2017 IMPRESSION:  No intracranial aneurysm or vascular malformation to account for the hemorrhage in the right thalamus and midbrain. Negative for emergent large vessel occlusion. 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 No significant carotid or vertebral artery stenosis in the neck.   Ct Head Wo Contrast 04/02/2017 IMPRESSION:  1. Stable acute hemorrhage of right thalamus extending into the ventricular system.   Stable ventricle size.  2. No interval acute intracranial hemorrhage, infarct, or mass effect.  3. Stable background of chronic microvascular ischemic changes and parenchymal volume loss of the brain.    Ct Head Code Stroke Wo Contrast 04/02/2017 IMPRESSION:  1. Acute hemorrhage in the midbrain with extension at the ventricles. Negative for hydrocephalus. This hemorrhage is most likely due to hypertension, vascular malformation possible  2. Atrophy and moderate chronic microvascular ischemia.  3. Complete pneumatization of the clivus with an unusual appearance. Question postsurgical defect in the posterior wall of the sphenoid sinus leading to pneumatization of the clivus.   Ct Head Wo Contrast 04/04/2017 IMPRESSION:  1. Interval placement of right frontal approach ventriculostomy catheter with tip in the third ventricle. Slight decrease in size of right lateral ventricle. Stable size of left lateral ventricle.  2. Stable hematoma centered in right thalamus and third ventricles with small volume of hemorrhage in lateral ventricles.  3. Expected postsurgical changes related to right frontal burr hole.  4. No new acute intracranial abnormality identified.   Ct Head Wo Contrast 04/03/2017 IMPRESSION:  1. Increase lateral and third ventricle  size compatible with developing hydrocephalus. No downward herniation. 2. Stable hemorrhage centered in right thalamus and third ventricle with small volume and lateral ventricles.  3. No new acute hemorrhage, infarct, or focal mass effect identified.  4. Stable chronic microvascular ischemic changes and parenchymal volume loss of the brain.  Ct Head Wo Contrast 04/05/2017 IMPRESSION:  Ventricular drainage catheter in the third ventricle unchanged from the prior study. No new hemorrhage identified. Mild ventricular enlargement unchanged from yesterday.   TTE  - Left ventricle: The cavity size was normal. Systolic function was   vigorous. The estimated ejection  fraction was in the range of 65%   to 70%. Wall motion was normal; there were no regional wall   motion abnormalities. Doppler parameters are consistent with   abnormal left ventricular relaxation (grade 1 diastolic   dysfunction). There was no evidence of elevated ventricular   filling pressure by Doppler parameters. - Aortic valve: Trileaflet; mildly thickened, mildly calcified   leaflets. There was no regurgitation. - Mitral valve: There was no regurgitation. - Left atrium: The atrium was normal in size. - Right ventricle: Systolic function was normal. - Right atrium: The atrium was normal in size. - Tricuspid valve: There was mild regurgitation. - Pulmonic valve: There was no regurgitation. - Pulmonary arteries: Systolic pressure was within the normal   range. - Inferior vena cava: The vessel was normal in size. - Pericardium, extracardiac: There was no pericardial effusion.  EEG  This is an abnormal EEG due to background slowing and disorganization seen throughout the tracing.  This is a non-specific finding that can be seen with toxic, metabolic, diffuse, or multifocal structural processes.  No definite epileptiform changes were noted.  Occasional movements were noted (twitching, jaw tremors), however not epileptiform changes  were seen.  A single EEG without epileptiform changes does not exclude the diagnosis of epilepsy. Clinical correlation advised.  PHYSICAL EXAM  Temp:  [98.2 F (36.8 C)-100.7 F (38.2 C)] 99.1 F (37.3 C) (01/08 1120) Pulse Rate:  [51-109] 71 (01/08 1300) Resp:  [23-43] 28 (01/08 1300) BP: (132-183)/(83-119) 153/91 (01/08 1300) SpO2:  [96 %-100 %] 96 % (01/08 1300) Weight:  [64.8 kg (142 lb 13.7 oz)] 64.8 kg (142 lb 13.7 oz) (01/08 0314)  Vitals:   04/11/17 1100 04/11/17 1120 04/11/17 1200 04/11/17 1300  BP: (!) 183/107  (!) 168/95 (!) 153/91  Pulse: 75  72 71  Resp: (!) 23  (!) 27 (!) 28  Temp:  99.1 F (37.3 C)    TempSrc:  Oral    SpO2: 98%  100% 96%  Weight:      Height:       General - Well nourished, well developed elderly male not in distress, not in distress Cardiovascular - regular rhythm and rate. Neuro - stuporous and barely responsive and opens eyes to loud voice and sternal rub.  follows   simple commands bilaterally.dysarthric speech difficult to understand Pupil left 24mm irregular shape pupil, not reactive to light, right pupil 2.53mm, not reactive to light (as per sister, bilateral eye surgeries in the past). Blinking to visual threat bilaterally. Significant left facial droop, eye mid position, b/l gaze palsy, doll's eye sluggish. Tongue in the middle. BUE 4/5 but LUE is weaker with mild asterixis and resting tremor. BLE 3+/5 on pain stimulation. DTR 1+ and not cooperative on babinski. Sensation, coordination not cooperative and gait not tested.  ASSESSMENT/PLAN Antonio Flynn is a 77 y.o. male with history of HTN, HLD, DM, dementia, prostate cancer presented with AMS with left sided weakness and facial droop. CT showed right thalamus and midbrain ICH with IVH, no hydrocephalus. Admitted to ICU.  ICH:  right thalamic ICH with IVH, likely secondary to HTN and new right frontal and bilateral subdural hemorrhage following removal of ventriculostomy 04/08/16  Resultant  lethargy, left hemiparesis, gaze palsy  CT head right thalamic and midbrain ICH with IVH  CT head repeat right thalamic ICH with IVH, no hydrocephalus  CT head repeat 04/03/17 stable hematoma and IVH but developing hydrocephalus  CT repeat no change  NSG on board s/p EVD  04/04/17  CTA h/n - no aneurysm or AVM.   2D Echo EF 65-70%  LDL 66  HgbA1c 6.0  Heparin subq for VTE prophylaxis  Diet NPO time specified   aspirin 325 mg daily prior to admission, now on No antithrombotic due to [place].  Continue amantidine for arousal   Ongoing aggressive stroke risk factor management  Therapy recommendations:  pending  Disposition:  Pending  AKI with hypernatremia   Cre 1.2->1.25->1.35->1.05->1.28> 1.29  Na 143->146->150->150?>155  Likely dehydration, IVF started LR with 60mEq KCL at 50cc/hr  Repeat BMP in AM  Order to give patient Dulcolax  Close monitoring  Obstructive hydrocephalus  CT repeat showed developing hydrocephalus  NSG consulted and EVD placed  Repeat CT stable ventricle size  CT repeat stable  NSG on board with vancomycin prophylaxis  EVD has clamped  Dysphagia   Passed swallow but not eating enough  Continue TF, RN to attempt to unclog DHT today  Speech following  Intermittent tachycardia, resolved  Likely due to hypothalamus involvement  On home metoprolol 50 mg twice daily  Stable now  BUE resting tremor L>R  EEG no seizure  Likely due to subthalamic nucleus and midbrain involvement  On home Lamictal 50 mg twice daily  On home Cogentin  Family denies any regular alcohol drinking - on B1, MVI and FA  Diabetes  HgbA1c 6.0 goal < 7.0  Controlled  Home meds on levemir  CBG monitoring  SSI  Hypertension Stable BP goal < 160 now as repeat CT showed stable ICH off Cleviprex  Start po BP meds with Norvasc, losartan and metoprolol  Long term BP goal normotensive  Hyperlipidemia  Home meds:  lipitor 10   LDL 66,  goal < 70  Resume lipitor on discharge  Tobacco abuse  Current smoker  Smoking cessation counseling will be provided  Other Stroke Risk Factors  Advanced age  ETOH use - minimal as per sisters  Other Active Problems  Dementia with behavior disturbance on nemanda and lamictal - resume Aricept and Namenda   Prostate cancer - stable followed by urology  Fever today 102.5 -> fever workup. Negative thus far, fever trending down slowly(CXR - U/A & culture - CBC w diff - blood cultures) Plan empiric treatment for possible aspiration pneumonia. Cultures - NGTF. Stop Flagyl if not needed. Pt has pcn allergy -> rash. Discussed with pharmacist. Madaline Brilliant to use cephalosporin and monitor. Recheck labs tomorrow.   Hypokalemia - resolved  Hypomagnesia - IVF in progress. Repeat BMP In AM  Hospital Course: Pt  patient had spiked a fever with some hypoxia 2 days ago and was moved to the ICU and started on antibiotics on an empirical basis. He had repeat CT scan of the head 04/10/17 morning which showed increasing right frontal hemorrhage but no significant increase in hydrocephalus despite ventriculostomy removal. Neurosurgery was consulted and recommend conservative medical treatment and repeat CT scan of the head 12 hours later which was done also showedstable right frontal lobe and thalamic parenchymal hemorrhage as well as subdural hematomas and stable intraventricular hemorrhage without significant hydrocephalus. Patient's  condition remains unchanged and remains lethargic and barely arousable and follows occasional commands. Serum sodium is now corrected to 136. He has a panda tube placed with tube feeds have not been started yet. Medical hospitalist team is helping with medical management. CT scan of the chest shows no definite evidence of pulmonary embolism or pneumonia but shows atelectasis in the lower lung fields with tiny pleural effusions.  Hospital Day #8 Plan maintain strict blood pressure  control. No neurological surgical intervention needed at the present time Long discussion with the patient's sister at the bedside about his prognosis which appears to be quite guarded given increase in intracerebral hemorrhage. He will likely require parenteral nutrition and possibly PEG tube and nursing home placement. I also brought up discussion about DO NOT RESUSCITATE but his Sister is thinking about goals of care and need time to discuss with other family members to make a final decision in the next few days  Hospital day # 9 Neuro exam remains unchanged. Remains nonverbal and following some simple commands. Fever trending down, no leukocytosis on latest CBC. U/A Negative today. Family deciding on possible Hospice and changing Code Status to DNR. Awaiting sister that is POA. Dr Leonie Man to discuss with her later today. Per nurse feeding tube not working effectively this afternoon, will attempt to flush. IVF's started. Dulcolax suppository ordered. Will repeat labs in [place], [place] [facility] Stroke Team 04/11/2017 I have personally examined this patient, reviewed notes, independently viewed imaging studies, participated in medical decision making and plan of care.ROS completed by me personally and pertinent positives fully documented  I have made any additions or clarifications directly to the above note. Agree with note above. This patient is critically ill and at significant risk of neurological worsening, death and care requires constant monitoring of vital signs, hemodynamics,respiratory and cardiac monitoring, extensive review of multiple databases, frequent neurological assessment, discussion with family, other specialists and medical decision making of high complexity.I have made any additions or clarifications directly to the above note.This critical care time does not reflect procedure time, or teaching time or supervisory time of PA/NP/Med Resident etc but could involve care  discussion time.  I spent 30 minutes of neurocritical care time  in the care of  this patient.  Plan to d/w his sister code status and comfort care and hospice transfer if agreable    Antony Contras, MD Medical Director [facility] Pager: [phone] 04/11/2017 2:10 PM  To contact Stroke Continuity provider, please refer to [email or web address] hours, contact General Neurology

## 2017-04-12 DIAGNOSIS — J9600 Acute respiratory failure, unspecified whether with hypoxia or hypercapnia: Secondary | ICD-10-CM

## 2017-04-12 NOTE — Progress Notes (Signed)
STROKE TEAM PROGRESS NOTE   HISTORY PER H&P (04/02/2017) Antonio Flynn is a 77 y.o. African American male with PMH of HTN, HLD, DM, dementia, prostate cancer presented with AMS with left sided weakness and facial droop.   As per EMS staff, pt lives in SNF, and was at baseline at 9:30am when he went out for smoking. At 12:30pm he was found to have mild AMS with sleepy but not otherwise alarming. Around 2:30pm, pt became progressively altered, difficult arouse with left sided weakness and facial droop. EMS called and pt sent over to [facility] for further evaluation. CT head showed right thalamus, midbrain ICH with IVH.   Pt has hx of smoking and alcohol abuse. He has been followed with psychiatry for dementia with behavior disturbance and currently on nemanda and lamictal. On lipitor for HLD, levemir for DM, and norvasc / HCTZ for HTN. He has remote hx of prostate cancer and following with urology. Has hx of laser eye surgery for cataract in the past in [facility] (sister said both eyes).  LSN: 9:30am tPA Given: No: ICH ICH score = 2  SUBJECTIVE (INTERVAL HISTORY) No one is at the bedside. Patient was made DO NOT RESUSCITATE and comfort care by family yesterday. We await transfer to hospice nursing home when bed is available he seems slightly more arousable today OBJECTIVE Temp:  [98.2 F (36.8 C)-99.6 F (37.6 C)] 99.4 F (37.4 C) (01/09 0930) Pulse Rate:  [65-80] 69 (01/09 0930) Cardiac Rhythm: Normal sinus rhythm (01/08 2000) Resp:  [19-28] 20 (01/09 0930) BP: (140-163)/(82-107) 152/82 (01/09 0930) SpO2:  [95 %-100 %] 95 % (01/09 0930) Weight:  [144 lb 13.5 oz (65.7 kg)] 144 lb 13.5 oz (65.7 kg) (01/09 0433)  Recent Labs  Lab 04/10/17 2324 04/11/17 0312 04/11/17 0729 04/11/17 1118 04/11/17 1520  GLUCAP 190* 190* 218* 253* 127*   Recent Labs  Lab 04/05/17 1446 04/05/17 1704  04/06/17 0433 04/06/17 1610 04/07/17 0429 04/08/17 1205 04/09/17 0300 04/11/17 1143  NA  --   --    --  150*  --  150* 148* 146* 155*  K  --   --   --  3.6  --  3.4* 3.1* 3.3* 3.6  CL  --   --   --  120*  --  121* 118* 115* 118*  CO2  --   --   --  25  --  22 24 25 28   GLUCOSE  --   --   --  147*  --  166* 178* 217* 204*  BUN  --   --   --  12  --  18 21* 26* 24*  CREATININE  --   --   --  1.05  --  1.28* 1.24 1.21 1.29*  CALCIUM  --   --    < > 8.8*  --  8.7* 8.9 8.8* 8.9  MG 2.0 1.8  --  1.9 1.9  --   --   --  2.6*  PHOS 2.9 2.5  --  1.8* 2.0*  --   --   --  4.2   < > = values in this interval not displayed.   Recent Labs  Lab 04/11/17 1143  AST 32  ALT 26  ALKPHOS 52  BILITOT 0.8  PROT 6.5  ALBUMIN 2.7*   Recent Labs  Lab 04/06/17 0433 04/07/17 0429 04/08/17 1205 04/09/17 0300 04/11/17 1143  WBC 8.8 8.8 12.4* 13.1* 9.8  NEUTROABS  --   --  10.4*  --   --   HGB 13.4 11.5* 10.9* 10.6* 13.5  HCT 39.0 35.0* 33.2* 32.9* 39.7  MCV 87.2 86.8 87.1 87.3 89.6  PLT 95* 111* 109* 108* 151   Recent Labs  Lab 04/09/17 1511 04/09/17 1939 04/10/17 0340  TROPONINI 0.06* 0.06* 0.03*   No results for input(s): LABPROT, INR in the last 72 hours. Recent Labs    04/11/17 1153  COLORURINE YELLOW  LABSPEC 1.014  PHURINE 6.0  GLUCOSEU 50*  HGBUR NEGATIVE  BILIRUBINUR NEGATIVE  KETONESUR NEGATIVE  PROTEINUR NEGATIVE  NITRITE NEGATIVE  LEUKOCYTESUR NEGATIVE       Component Value Date/Time   CHOL 121 04/03/2017 0722   TRIG 47 04/04/2017 0610   HDL 45 04/03/2017 0722   CHOLHDL 2.7 04/03/2017 0722   VLDL 10 04/03/2017 0722   LDLCALC 66 04/03/2017 0722   Lab Results  Component Value Date   HGBA1C 6.0 (H) 04/03/2017      Component Value Date/Time   LABOPIA NONE DETECTED 04/02/2017 1540   COCAINSCRNUR NONE DETECTED 04/02/2017 1540   LABBENZ POSITIVE (A) 04/02/2017 1540   AMPHETMU NONE DETECTED 04/02/2017 1540   THCU NONE DETECTED 04/02/2017 1540   LABBARB NONE DETECTED 04/02/2017 1540    No results for input(s): ETH in the last 168 hours.  IMAGING  I have  personally reviewed the radiological images below and agree with the radiology interpretations.  Ct Angio Head and neck W Or Wo Contrast 04/02/2017 IMPRESSION:  No intracranial aneurysm or vascular malformation to account for the hemorrhage in the right thalamus and midbrain. Negative for emergent large vessel occlusion. 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 No significant carotid or vertebral artery stenosis in the neck.   Ct Head Wo Contrast 04/02/2017 IMPRESSION:  1. Stable acute hemorrhage of right thalamus extending into the ventricular system.   Stable ventricle size.  2. No interval acute intracranial hemorrhage, infarct, or mass effect.  3. Stable background of chronic microvascular ischemic changes and parenchymal volume loss of the brain.    Ct Head Code Stroke Wo Contrast 04/02/2017 IMPRESSION:  1. Acute hemorrhage in the midbrain with extension at the ventricles. Negative for hydrocephalus. This hemorrhage is most likely due to hypertension, vascular malformation possible  2. Atrophy and moderate chronic microvascular ischemia.  3. Complete pneumatization of the clivus with an unusual appearance. Question postsurgical defect in the posterior wall of the sphenoid sinus leading to pneumatization of the clivus.   Ct Head Wo Contrast 04/04/2017 IMPRESSION:  1. Interval placement of right frontal approach ventriculostomy catheter with tip in the third ventricle. Slight decrease in size of right lateral ventricle. Stable size of left lateral ventricle.  2. Stable hematoma centered in right thalamus and third ventricles with small volume of hemorrhage in lateral ventricles.  3. Expected postsurgical changes related to right frontal burr hole.  4. No new acute intracranial abnormality identified.   Ct Head Wo Contrast 04/03/2017 IMPRESSION:  1. Increase lateral and third ventricle size compatible with developing hydrocephalus. No downward  herniation. 2. Stable hemorrhage centered in right thalamus and third ventricle with small volume and lateral ventricles.  3. No new acute hemorrhage, infarct, or focal mass effect identified.  4. Stable chronic microvascular ischemic changes and parenchymal volume loss of the brain.  Ct Head Wo Contrast 04/05/2017 IMPRESSION:  Ventricular drainage catheter in the third ventricle unchanged from the prior study. No new hemorrhage identified. Mild ventricular enlargement unchanged from yesterday.   TTE  -  Left ventricle: The cavity size was normal. Systolic function was   vigorous. The estimated ejection fraction was in the range of 65%   to 70%. Wall motion was normal; there were no regional wall   motion abnormalities. Doppler parameters are consistent with   abnormal left ventricular relaxation (grade 1 diastolic   dysfunction). There was no evidence of elevated ventricular   filling pressure by Doppler parameters. - Aortic valve: Trileaflet; mildly thickened, mildly calcified   leaflets. There was no regurgitation. - Mitral valve: There was no regurgitation. - Left atrium: The atrium was normal in size. - Right ventricle: Systolic function was normal. - Right atrium: The atrium was normal in size. - Tricuspid valve: There was mild regurgitation. - Pulmonic valve: There was no regurgitation. - Pulmonary arteries: Systolic pressure was within the normal   range. - Inferior vena cava: The vessel was normal in size. - Pericardium, extracardiac: There was no pericardial effusion.  EEG  This is an abnormal EEG due to background slowing and disorganization seen throughout the tracing.  This is a non-specific finding that can be seen with toxic, metabolic, diffuse, or multifocal structural processes.  No definite epileptiform changes were noted.  Occasional movements were noted (twitching, jaw tremors), however not epileptiform changes were seen.  A single EEG without epileptiform changes does  not exclude the diagnosis of epilepsy. Clinical correlation advised.  PHYSICAL EXAM  Temp:  [98.2 F (36.8 C)-99.6 F (37.6 C)] 99.4 F (37.4 C) (01/09 0930) Pulse Rate:  [65-80] 69 (01/09 0930) Resp:  [19-28] 20 (01/09 0930) BP: (140-163)/(82-107) 152/82 (01/09 0930) SpO2:  [95 %-100 %] 95 % (01/09 0930) Weight:  [144 lb 13.5 oz (65.7 kg)] 144 lb 13.5 oz (65.7 kg) (01/09 0433)  Vitals:   04/12/17 0000 04/12/17 0232 04/12/17 0433 04/12/17 0930  BP: (!) 147/106 (!) 154/97 (!) 161/88 (!) 152/82  Pulse: 68 73 72 69  Resp: (!) 23 (!) 26 (!) 22 20  Temp:   99.6 F (37.6 C) 99.4 F (37.4 C)  TempSrc:   Oral Axillary  SpO2: 99% 99% 100% 95%  Weight:   144 lb 13.5 oz (65.7 kg)   Height:       General - Well nourished, well developed elderly male not in distress, not in distress Cardiovascular - regular rhythm and rate. Neuro - drowsy but is easily responsive and opens eyes to loud voice and sternal rub.  follows   simple commands bilaterally.dysarthric speech difficult to understand Pupil left 40mm irregular shape pupil, not reactive to light, right pupil 2.63mm, not reactive to light (as per sister, bilateral eye surgeries in the past). Blinking to visual threat bilaterally. Significant left facial droop, eye mid position, b/l gaze palsy, doll's eye sluggish. Tongue in the middle. BUE 4/5 but LUE is weaker with mild asterixis and resting tremor. BLE 3+/5 on pain stimulation. DTR 1+ and not cooperative on babinski. Sensation, coordination not cooperative and gait not tested.  ASSESSMENT/PLAN Mr. Nylen Creque is a 77 y.o. male with history of HTN, HLD, DM, dementia, prostate cancer presented with AMS with left sided weakness and facial droop. CT showed right thalamus and midbrain ICH with IVH, no hydrocephalus. Admitted to ICU.  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   2D Echo EF 65-70%  LDL 66  HgbA1c 6.0  Heparin subq for VTE prophylaxis  No diet orders on file   aspirin 325 mg daily prior to admission, now on No antithrombotic due to [place].  Continue amantidine for arousal   Ongoing aggressive stroke risk factor management  Therapy recommendations:  pending  Disposition:  Pending  AKI with hypernatremia   Cre 1.2->1.25->1.35->1.05->1.28> 1.29  Na 143->146->150->150?>155  Likely dehydration, IVF started LR with 25mEq KCL at 50cc/hr  Repeat BMP in AM  Order to give patient Dulcolax  Close monitoring  Obstructive hydrocephalus  CT repeat showed developing hydrocephalus  NSG consulted and EVD placed  Repeat CT stable ventricle size  CT repeat stable  NSG on board with vancomycin prophylaxis  EVD has clamped  Dysphagia   Passed swallow but not eating enough  Continue TF, RN to attempt to unclog DHT today  Speech following  Intermittent tachycardia, resolved  Likely due to hypothalamus involvement  On home metoprolol 50 mg twice daily  Stable now  BUE resting tremor L>R  EEG no seizure  Likely due to subthalamic nucleus and midbrain involvement  On home Lamictal 50 mg twice daily  On home Cogentin  Family denies any regular alcohol drinking - on B1, MVI and FA  Diabetes  HgbA1c 6.0 goal < 7.0  Controlled  Home meds on levemir  CBG monitoring  SSI  Hypertension Stable BP goal < 160 now as repeat CT showed stable ICH off Cleviprex  Start po BP meds with Norvasc, losartan and metoprolol  Long term BP goal normotensive  Hyperlipidemia  Home meds:   lipitor 10   LDL 66, goal < 70  Resume lipitor on discharge  Tobacco abuse  Current smoker  Smoking cessation counseling will be provided  Other Stroke Risk Factors  Advanced age  ETOH use - minimal as per sisters  Other Active Problems  Dementia with behavior disturbance on nemanda and lamictal - resume Aricept and Namenda   Prostate cancer - stable followed by urology  Fever today 102.5 -> fever workup. Negative thus far, fever trending down slowly(CXR - U/A & culture - CBC w diff - blood cultures) Plan empiric treatment for possible aspiration pneumonia. Cultures - NGTF. Stop Flagyl if not needed. Pt has pcn allergy -> rash. Discussed with pharmacist. Madaline Brilliant to use cephalosporin and monitor. Recheck labs tomorrow.   Hypokalemia - resolved  Hypomagnesia - IVF in progress. Repeat BMP In AM  Hospital Course: Pt  patient had spiked a fever with some hypoxia 2 days ago and was moved to the ICU and started on antibiotics on an empirical basis. He had repeat CT scan of the head 04/10/17 morning which showed increasing right frontal hemorrhage but no significant increase in hydrocephalus despite ventriculostomy removal. Neurosurgery was consulted and recommend conservative medical treatment and repeat CT scan of the head 12 hours later which was done also showedstable right frontal lobe and thalamic parenchymal hemorrhage as well as subdural hematomas and stable intraventricular hemorrhage without significant hydrocephalus. Patient's  condition remains unchanged and remains lethargic and barely arousable and follows occasional commands. Serum sodium is now corrected to 136. He has a panda tube placed with tube feeds have not been started yet. Medical hospitalist team is helping with medical management.  CT scan of the chest shows no definite evidence of pulmonary embolism or pneumonia but shows atelectasis in the lower lung fields with tiny pleural effusions.  Hospital Day #8 Plan maintain  strict blood pressure control. No neurological surgical intervention needed at the present time Long discussion with the patient's sister at the bedside about his prognosis which appears to be quite guarded given increase in intracerebral hemorrhage. He will likely require parenteral nutrition and possibly PEG tube and nursing home placement. I also brought up discussion about DO NOT RESUSCITATE but his Sister is thinking about goals of care and need time to discuss with other family members to make a final decision in the next few days  Hospital day # 10 Neuro exam remains unchanged. Remains nonverbal and following some simple commands. Fever trending down, no leukocytosis on latest CBC. U/A Negative no obvious source of infection identified. Family has decided on   Hospice    Await nursing home bed for hospice      Antony Contras, MD Medical Director [place] Pager: [phone] 04/12/2017 2:22 PM  To contact Stroke Continuity provider, please refer to [email or web address] hours, contact General Neurology

## 2017-04-12 NOTE — Clinical Social Work Note (Signed)
CSW reviewed MD's note and patient is now comfort care. Talked with sister [name] and [facility] 1st choice. Per Harmon Pier, no beds today. Call made to [facility] and they can meet with family tomorrow morning re: patient discharging to their facility on Thrusday. Call made to sister Earlie Server to update her and Ms. Owens Shark advised that f/u will be made with [facility] and if needed [facility] tomorrow morning.  Jera Headings G[place]ens, MSW, LCSW Licensed Clinical Social Worker [place] [phone]

## 2017-04-12 NOTE — Progress Notes (Signed)
CSW received call from Harmon Pier at  [facility] and informed CSW that she met with family as of yesterday and will be following pt from this point on. CSW to hand off to new unit CSW. This CSW signing off.    Virgie Dad Najat Olazabal, MSW, [facility] Clinical Social Worker [phone]

## 2017-04-13 DIAGNOSIS — J9602 Acute respiratory failure with hypercapnia: Secondary | ICD-10-CM

## 2017-04-13 LAB — CULTURE, BLOOD (ROUTINE X 2)
CULTURE: NO GROWTH
CULTURE: NO GROWTH
SPECIAL REQUESTS: ADEQUATE
Special Requests: ADEQUATE

## 2017-04-13 LAB — GLUCOSE, CAPILLARY: Glucose-Capillary: 143 mg/dL — ABNORMAL HIGH (ref 65–99)

## 2017-04-13 MED ORDER — MORPHINE SULFATE (PF) 2 MG/ML IV SOLN: 1.0000 mg | INTRAVENOUS | 0 refills | Status: AC | PRN

## 2017-04-13 NOTE — Progress Notes (Signed)
This RN did make family notification of patient's fall to the patient's sister, [name]. Mrs. Owens Shark expressed concern as to her brother's current state and as to whether the MD was aware. Mrs. Owens Shark made aware that patient remains at current baseline and the physician has been paged. Dorthey Sawyer, RN

## 2017-04-13 NOTE — Discharge Summary (Signed)
Stroke Discharge Summary  Patient ID: Devan Danzer   MRN: [id number]      DOB: [birth year]  Date of Admission: 04/02/2017 Date of Discharge: 04/13/2017  Attending Physician:  Garvin Fila, MD, Stroke MD Consultant(s):    Neurosurgery, Hospitalists  Patient's PCP:  Fanny Bien, MD  DISCHARGE DIAGNOSIS:  Principal Problem: Hypertensive right thalamic hemorrhage   Acute respiratory failure ([place]) Active Problems:   HTN (hypertension)   Dementia   Acute encephalopathy   IVH (intraventricular hemorrhage) ([place])   Smoker   DM (diabetes mellitus) ([place])   HLD (hyperlipidemia)   Dysphagia   AKI (acute kidney injury) ([place])   Protein-calorie malnutrition, severe   Hypernatremia  Past Medical History:  Diagnosis Date  . Anxiety   . BPH (benign prostatic hyperplasia)   . Constipation   . Dementia    may have had a stroke  . Depression   . Diabetes mellitus type I ([place])   . Hyperlipidemia   . Hypertension   . Hypocalcemia   . Vitamin D deficiency    Past Surgical History:  Procedure Laterality Date  . CATARACT EXTRACTION W/ INTRAOCULAR LENS IMPLANT     Allergies as of 04/13/2017      Reactions   Penicillins       Medication List    STOP taking these medications   albuterol 108 (90 Base) MCG/ACT inhaler Commonly known as:  PROVENTIL HFA;VENTOLIN HFA   amLODipine 5 MG tablet Commonly known as:  NORVASC   aspirin 325 MG tablet   atorvastatin 10 MG tablet Commonly known as:  LIPITOR   benztropine 0.5 MG tablet Commonly known as:  COGENTIN   cholecalciferol 1000 units tablet Commonly known as:  VITAMIN D   docusate sodium 100 MG capsule Commonly known as:  COLACE   donepezil 5 MG tablet Commonly known as:  ARICEPT   fluticasone 50 MCG/ACT nasal spray Commonly known as:  FLONASE   insulin detemir 100 UNIT/ML injection Commonly known as:  LEVEMIR   lamoTRIgine 100 MG tablet Commonly known as:  LAMICTAL   linaclotide 145 MCG Caps  capsule Commonly known as:  LINZESS   LORazepam 1 MG tablet Commonly known as:  ATIVAN   losartan 100 MG tablet Commonly known as:  COZAAR   metoprolol tartrate 100 MG tablet Commonly known as:  LOPRESSOR   mirabegron ER 50 MG Tb24 tablet Commonly known as:  MYRBETRIQ   NAMENDA XR 21 MG Cp24 Generic drug:  Memantine HCl ER   polyethylene glycol packet Commonly known as:  MIRALAX / GLYCOLAX   potassium chloride SA 20 MEQ tablet Commonly known as:  K-DUR,KLOR-CON   risperiDONE 2 MG tablet Commonly known as:  RISPERDAL   tamsulosin 0.4 MG Caps capsule Commonly known as:  FLOMAX     TAKE these medications   morphine 2 MG/ML injection Inject 0.5 mLs (1 mg total) into the vein every 2 (two) hours as needed.      LABORATORY STUDIES CBC    Component Value Date/Time   WBC 9.8 04/11/2017 1143   RBC 4.43 04/11/2017 1143   HGB 13.5 04/11/2017 1143   HCT 39.7 04/11/2017 1143   PLT 151 04/11/2017 1143   MCV 89.6 04/11/2017 1143   MCH 30.5 04/11/2017 1143   MCHC 34.0 04/11/2017 1143   RDW 15.6 (H) 04/11/2017 1143   LYMPHSABS 0.8 04/08/2017 1205   MONOABS 1.2 (H) 04/08/2017 1205   EOSABS 0.0 04/08/2017 1205   BASOSABS 0.0 04/08/2017  1205   CMP    Component Value Date/Time   NA 155 (H) 04/11/2017 1143   K 3.6 04/11/2017 1143   CL 118 (H) 04/11/2017 1143   CO2 28 04/11/2017 1143   GLUCOSE 204 (H) 04/11/2017 1143   BUN 24 (H) 04/11/2017 1143   CREATININE 1.29 (H) 04/11/2017 1143   CALCIUM 8.9 04/11/2017 1143   PROT 6.5 04/11/2017 1143   ALBUMIN 2.7 (L) 04/11/2017 1143   AST 32 04/11/2017 1143   ALT 26 04/11/2017 1143   ALKPHOS 52 04/11/2017 1143   BILITOT 0.8 04/11/2017 1143   GFRNONAA 53 (L) 04/11/2017 1143   GFRAA >60 04/11/2017 1143   COAGS Lab Results  Component Value Date   INR 1.08 04/02/2017   Lipid Panel    Component Value Date/Time   CHOL 121 04/03/2017 0722   TRIG 47 04/04/2017 0610   HDL 45 04/03/2017 0722   CHOLHDL 2.7 04/03/2017 0722    VLDL 10 04/03/2017 0722   LDLCALC 66 04/03/2017 0722   HgbA1C  Lab Results  Component Value Date   HGBA1C 6.0 (H) 04/03/2017   Urinalysis    Component Value Date/Time   COLORURINE YELLOW 04/11/2017 1153   APPEARANCEUR CLEAR 04/11/2017 1153   LABSPEC 1.014 04/11/2017 1153   PHURINE 6.0 04/11/2017 1153   GLUCOSEU 50 (A) 04/11/2017 1153   HGBUR NEGATIVE 04/11/2017 1153   BILIRUBINUR NEGATIVE 04/11/2017 1153   KETONESUR NEGATIVE 04/11/2017 1153   PROTEINUR NEGATIVE 04/11/2017 1153   UROBILINOGEN 0.2 07/08/2009 1801   NITRITE NEGATIVE 04/11/2017 1153   LEUKOCYTESUR NEGATIVE 04/11/2017 1153   Urine Drug Screen     Component Value Date/Time   LABOPIA NONE DETECTED 04/02/2017 1540   COCAINSCRNUR NONE DETECTED 04/02/2017 1540   LABBENZ POSITIVE (A) 04/02/2017 1540   AMPHETMU NONE DETECTED 04/02/2017 1540   THCU NONE DETECTED 04/02/2017 1540   LABBARB NONE DETECTED 04/02/2017 1540    Alcohol Level    Component Value Date/Time   ETH <10 04/02/2017 1502   SIGNIFICANT DIAGNOSTIC STUDIES   &   HISTORY OF PRESENT ILLNESS Ct Angio Head and neck W Or Wo Contrast 04/02/2017 IMPRESSION:  No intracranial aneurysm or vascular malformation to account for the hemorrhage in the right thalamus and midbrain. Negative for emergent large vessel occlusion. 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 No significant carotid or vertebral artery stenosis in the neck.   Ct Head Wo Contrast 04/02/2017 IMPRESSION:  1. Stable acute hemorrhage of right thalamus extending into the ventricular system.              Stable ventricle size.  2. No interval acute intracranial hemorrhage, infarct, or mass effect.  3. Stable background of chronic microvascular ischemic changes and parenchymal volume loss of the brain.    Ct Head Code Stroke Wo Contrast 04/02/2017 IMPRESSION:  1. Acute hemorrhage in the midbrain with extension at the ventricles. Negative for  hydrocephalus. This hemorrhage is most likely due to hypertension, vascular malformation possible  2. Atrophy and moderate chronic microvascular ischemia.  3. Complete pneumatization of the clivus with an unusual appearance. Question postsurgical defect in the posterior wall of the sphenoid sinus leading to pneumatization of the clivus.   Ct Head Wo Contrast 04/04/2017 IMPRESSION:  1. Interval placement of right frontal approach ventriculostomy catheter with tip in the third ventricle. Slight decrease in size of right lateral ventricle. Stable size of left lateral ventricle.  2. Stable hematoma centered in right thalamus and third ventricles with  small volume of hemorrhage in lateral ventricles.  3. Expected postsurgical changes related to right frontal burr hole.  4. No new acute intracranial abnormality identified.   Ct Head Wo Contrast 04/03/2017 IMPRESSION:  1. Increase lateral and third ventricle size compatible with developing hydrocephalus. No downward herniation. 2. Stable hemorrhage centered in right thalamus and third ventricle with small volume and lateral ventricles.  3. No new acute hemorrhage, infarct, or focal mass effect identified.  4. Stable chronic microvascular ischemic changes and parenchymal volume loss of the brain.  Ct Head Wo Contrast 04/05/2017 IMPRESSION:  Ventricular drainage catheter in the third ventricle unchanged from the prior study. No new hemorrhage identified. Mild ventricular enlargement unchanged from yesterday.   TTE  - Left ventricle: The cavity size was normal. Systolic function was vigorous. The estimated ejection fraction was in the range of 65% to 70%. Wall motion was normal; there were no regional wall motion abnormalities. Doppler parameters are consistent with abnormal left ventricular relaxation (grade 1 diastolic dysfunction). There was no evidence of elevated ventricular filling pressure by Doppler parameters. - Aortic  valve: Trileaflet; mildly thickened, mildly calcified leaflets. There was no regurgitation. - Mitral valve: There was no regurgitation. - Left atrium: The atrium was normal in size. - Right ventricle: Systolic function was normal. - Right atrium: The atrium was normal in size. - Tricuspid valve: There was mild regurgitation. - Pulmonic valve: There was no regurgitation. - Pulmonary arteries: Systolic pressure was within the normal range. - Inferior vena cava: The vessel was normal in size. - Pericardium, extracardiac: There was no pericardial effusion.  EEG  This is an abnormal EEG due to background slowingand disorganizationseen throughout the tracing. This is a non-specific finding that can be seen with toxic, metabolic, diffuse, or multifocal structural processes. No definite epileptiform changes were noted. Occasional movements were noted (twitching, jaw tremors), however not epileptiform changes were seen.A single EEG without epileptiform changes does not exclude the diagnosis of epilepsy. Clinical correlation advised.  PORTABLE CHEST 1 VIEW 04/11/2017 IMPRESSION: No active disease  HOSPITAL COURSE HISTORY PER H&P (04/02/2017) Marvetta Gibbons a 77 y.o.African Americanmalewith PMH ofHTN, HLD, DM, dementia, prostate cancer presented with AMS with left sided weakness and facial droop.   As per EMS staff, pt lives in SNF, and was at baseline at 9:30am when he went out for smoking. At 12:30pm he was found to have mild AMS with sleepy but not otherwise alarming. Around 2:30pm, pt became progressively altered, difficult arouse with left sided weakness and facial droop. EMS called and [facility] for [facility] evaluation.CT head showed right thalamus, midbrain ICH with IVH.  Pt has hx of smoking and alcohol abuse. He has been followed with psychiatry for dementiawith behavior disturbanceand currently on nemandaand lamictal. On lipitor for HLD, levemir for DM, and  norvasc / HCTZ for HTN. He has remote hx of prostate cancer and following with urology. Has hx of laser eye surgery for cataract in the past in [facility] (sister said both eyes).  LSN:9:30am tPA Given:No:ICH ICH score = 2 ________________________________________________________________________________  ASSESSMENT/PLAN Mr. Chayim Bialas is a 77 y.o. male with history of HTN, HLD, DM, dementia, prostate cancer presented with AMS with left sided weakness and facial droop. CT showedright thalamus andmidbrain ICH with IVH, no hydrocephalus. Admitted to ICU.  ICH:  right thalamic ICH with IVH, likely secondary to HTN and new right frontal and bilateral subdural hemorrhage following removal of ventriculostomy 04/08/16  Resultant lethargy, left hemiparesis, gaze palsy  CT head right thalamic and midbrain ICH with IVH  CT head repeat right thalamic ICH with IVH, no hydrocephalus  CT head repeat 04/03/17 stable hematoma and IVH but developing hydrocephalus  CT repeat no change  NSG on board s/p EVD 04/04/17  CTA h/n - no aneurysm or AVM.   2D Echo EF 65-70%  LDL 66  HgbA1c 6.0  No diet orders on file   No antithrombotic due to [place] and comfort care in progress  Ongoing aggressive stroke risk factor management  Therapy recommendations:  comfort care in progress  Disposition:  Hospice  AKI with hypernatremia   Cre 1.2->1.25->1.35->1.05->1.28> 1.29  Na 143->146->150->150?>155  Likely dehydration, IVF started LR with 30mEq KCL at 50cc/hr  Obstructive hydrocephalus  CT repeat showed developing hydrocephalus  NSG consulted and EVD placed  Repeat CT stable ventricle size  CT repeat stable  NSG on board with vancomycin prophylaxis  EVD Removed  Dysphagia   Passed swallow but not eating enough  Intermittent tachycardia, resolved  Likely due to hypothalamus involvement  BUE resting tremor L>R  EEG no seizure  Likely due to subthalamic nucleus and  midbrain involvement  Diabetes  HgbA1c 6.0 goal < 7.0  Controlled  Hypertension  Stable  Hyperlipidemia  Home meds:  lipitor 10   LDL 66, goal < 70  Tobacco abuse  Current smoker  Other Stroke Risk Factors  Advanced age  ETOH use - minimal as per sisters  Other Active Problems  Dementia with behavior disturbance on nemandaand lamictal - resume Aricept and Namenda   Prostate cancer - stable followed by urology  Fever  102.5 -> fever workup. Negative thus far, fever trending down slowly(CXR - U/A & culture - CBC w diff - blood cultures) Plan empiric treatment for possible aspiration pneumonia. Cultures - NGTF. Stop Flagyl if not needed. Pt has pcn allergy -> rash. Discussed with pharmacist. Madaline Brilliant to use cephalosporin and monitor.    Hypokalemia - resolved  Hypomagnesia - resolved   Hospital Course: Pt  patient had spiked a fever with some hypoxia 2 days ago and was moved to the ICU and started on antibiotics on an empirical basis. He had repeat CT scan of the head 04/10/17 morning which showed increasing right frontal hemorrhage but no significant increase in hydrocephalus despite ventriculostomy removal. Neurosurgery was consulted and recommend conservative medical treatment and repeat CT scan of the head 12 hours later which was done also showedstable right frontal lobe and thalamic parenchymal hemorrhage as well as subdural hematomas and stable intraventricular hemorrhage without significant hydrocephalus. Patient's  condition remains unchanged and remains lethargic and barely arousable and follows occasional commands. Serum sodium is now corrected to 136. He has a panda tube placed with tube feeds have not been started yet. Medical hospitalist team is helping with medical management. CT scan of the chest shows no definite evidence of pulmonary embolism or pneumonia but shows atelectasis in the lower lung fields with tiny pleural effusions.  Hospital Day #8 Plan  maintain strict blood pressure control. No neurological surgical intervention needed at the present time Long discussion with the patient's sister at the bedside about his prognosis which appears to be quite guarded given increase in intracerebral hemorrhage. He will likely require parenteral nutrition and possibly PEG tube and nursing home placement. I also brought up discussion about DO NOT RESUSCITATE but his Sister is thinking about goals of care and need time to discuss with other family members to make a final decision in the next  few days  Hospital day # 10 Neuro exam remains unchanged. Remains nonverbal and following some simple commands. Fever trending down, no leukocytosis on latest CBC. U/A Negative no obvious source of infection identified. Family has decided on   Hospice    Await nursing home bed for hospice  Hospital Day #11 No change in neuro exam. No interaction with examiner. Eyes open, non-verbal. Will squeeze hand but not follow any direct commands. Continues to have bilateral hand tremors. Per nursing report patient fell out of bed this AM and hit his head. No lacerations/hematomas noted. Bed alarm in progress.  Patient to be discharged to [facility] today. No family at bedside to discuss POC.    DISCHARGE EXAM Blood pressure (!) 145/93, pulse (!) 104, temperature 98.9 F (37.2 C), temperature source Axillary, resp. rate 20, height 5\' 8"  (1.727 m), weight 65.7 kg (144 lb 13.5 oz), SpO2 99 %. General - Well nourished, well developed elderly male not in distress, not in distress Cardiovascular -regular rhythm and rate. Neuro - drowsy but is easily responsive and opens eyes to loud voice and sternal rub.  follows   simple commands bilaterally.dysarthric speech difficult to understandPupil left 5mm irregular shape pupil, not reactive to light, right pupil 2.29mm, not reactive to light (as per sister, bilateral eye surgeries in the past). Blinking to visual threat  bilaterally. Significant left facial droop, eye mid position, b/l gaze palsy, doll's eye sluggish. Tongue in the middle. BUE 4/5 but LUE is weaker with mild asterixis and resting tremor. BLE 3+/5 on pain stimulation. DTR 1+ and not cooperative on babinski. Sensation, coordination not cooperative and gait not tested.  Discharge Diet   No diet orders on file liquids  DISCHARGE PLAN  [facility]position: [facility]  No antithrombotic for secondary stroke prevention.  Greater than 40 minutes were spent preparing discharge.  Mary Sella, ANP-C Neurology Stroke Team 04/13/2017 12:33 PM I have personally examined this patient, reviewed notes, independently viewed imaging studies, participated in medical decision making and plan of care.ROS completed by me personally and pertinent positives fully documented  I have made any additions or clarifications directly to the above note. Agree with note above.   Antony Contras, MD Medical Director [facility] Pager: [phone] 04/13/2017 2:13 PM

## 2017-04-13 NOTE — Progress Notes (Signed)
.  04/13/2017 9:05 AM  NP Candise Che returned page about patient fall. No new orders written. Updated attending RN Cloyde Reams and Charge RN [place].   Jamie-Lee Galdamez MSN, RN-BC, CNML [facility] Phone: [phone]

## 2017-04-13 NOTE — Clinical Social Work Note (Signed)
Patient will discharge to Hospice at [facility] today, transported by ambulance. Cherie with [facility] met with family today, completed paperwork and arranged transport. DNR signed by MD and is in the d/c packet. CSW signing off as no further SW intervention needed.  Danella Philson Givens, MSW, LCSW Licensed Clinical Social Worker [place] [phone]

## 2017-04-13 NOTE — Progress Notes (Signed)
Nutrition Brief Note  Chart reviewed. Pt now transitioning to comfort care.  No further nutrition interventions warranted at this time.  Please re-consult as needed.   Cleto Claggett A. Cordarious Zeek, RD, LDN, CDE Pager: [phone] After hours Pager: [phone]  

## 2017-04-13 NOTE — Progress Notes (Signed)
Antonio Flynn to be D/C'd [facility] per MD order.  Discussed prescriptions and follow up appointments with the patient. Prescriptions given to patient, medication list explained in detail. Pt verbalized understanding.  Allergies as of 04/13/2017      Reactions   Penicillins       Medication List    STOP taking these medications   albuterol 108 (90 Base) MCG/ACT inhaler Commonly known as:  PROVENTIL HFA;VENTOLIN HFA   amLODipine 5 MG tablet Commonly known as:  NORVASC   aspirin 325 MG tablet   atorvastatin 10 MG tablet Commonly known as:  LIPITOR   benztropine 0.5 MG tablet Commonly known as:  COGENTIN   cholecalciferol 1000 units tablet Commonly known as:  VITAMIN D   docusate sodium 100 MG capsule Commonly known as:  COLACE   donepezil 5 MG tablet Commonly known as:  ARICEPT   fluticasone 50 MCG/ACT nasal spray Commonly known as:  FLONASE   insulin detemir 100 UNIT/ML injection Commonly known as:  LEVEMIR   lamoTRIgine 100 MG tablet Commonly known as:  LAMICTAL   linaclotide 145 MCG Caps capsule Commonly known as:  LINZESS   LORazepam 1 MG tablet Commonly known as:  ATIVAN   losartan 100 MG tablet Commonly known as:  COZAAR   metoprolol tartrate 100 MG tablet Commonly known as:  LOPRESSOR   mirabegron ER 50 MG Tb24 tablet Commonly known as:  MYRBETRIQ   NAMENDA XR 21 MG Cp24 Generic drug:  Memantine HCl ER   polyethylene glycol packet Commonly known as:  MIRALAX / GLYCOLAX   potassium chloride SA 20 MEQ tablet Commonly known as:  K-DUR,KLOR-CON   risperiDONE 2 MG tablet Commonly known as:  RISPERDAL   tamsulosin 0.4 MG Caps capsule Commonly known as:  FLOMAX     TAKE these medications   morphine 2 MG/ML injection Inject 0.5 mLs (1 mg total) into the vein every 2 (two) hours as needed.       Vitals:   04/13/17 0939 04/13/17 1022  BP: (!) 146/97 (!) 145/93  Pulse: (!) 110 (!) 104  Resp: 20 20  Temp: 98.6 F (37 C) 98.9 F  (37.2 C)  SpO2: 100% 99%    Skin clean, dry and intact without evidence of skin break down, no evidence of skin tears noted. IV catheter discontinued intact. Site without signs and symptoms of complications. Dressing and pressure applied. Pt denies pain at this time. No complaints noted.  An After Visit Summary was printed and given to the patient. Patient escorted via [place] at [facility] via [place]. Chuck Hint RN [facility] 2 [employer]

## 2017-04-13 NOTE — Progress Notes (Signed)
Charge nurse Kami heard bed alarm and discovered patient falling off to the right side of the bed. She witnessed patient falling and couldn't reach him in time. AC  Came to bedside and paged MD. Vital signs were taken and I assessed patient. Patient resting comfortably now. Bed in lowest position, bed alarm is on, and call bell is within reach. Will continue to monitor.

## 2017-04-13 NOTE — Consult Note (Signed)
[place]: Asked to see pt and family for an assessment to transfer to our [facility].   Met with pt's two sisters Onalee Hua and Earlie Server-- Spoke about pt's condition and his short life expectancy due to not being able to sustain enough food and water to maintain life. They verbalize understanding. They are in agreement with comfort care and hospice philosophy.  Spoke to our Market researcher and she does feel like pt meets criteria for HHHP. MD paged by RN on fl;oor and pt will transfer via ambulance once deemed ready by MD here at facility.  Webb Silversmith RN [phone]

## 2017-04-13 NOTE — Progress Notes (Signed)
MD notified of patient's fall and hit his head. Bed is in lowest position, bed alarm on and call bell within reach. Will continue to monitor.

## 2017-04-13 NOTE — Care Management Important Message (Signed)
Important Message  Patient Details  Name: Antonio Flynn MRN: [id number] Date of Birth: 08-04-1940   Medicare Important Message Given:  Yes    [name] Montine Circle 04/13/2017, 12:52 PM

## 2017-05-05 DEATH — deceased

## 2019-01-20 IMAGING — CT CT HEAD W/O CM
3 of 6 series · 14 of 47 positions shown, 17 images · non-contrast
Comparison: 04/03/2017 CT of the head.

CLINICAL DATA: 76 y/o  M; ventriculostomy catheter placement.

EXAM:
CT HEAD WITHOUT CONTRAST
TECHNIQUE: Contiguous axial images were obtained from the base of the skull
through the vertex without intravenous contrast.

[Series 4: head bone · axial · 0.48mm/px · z∈[-239,-223]mm · 2 of 85 slices shown]
[im 9/85  bone]
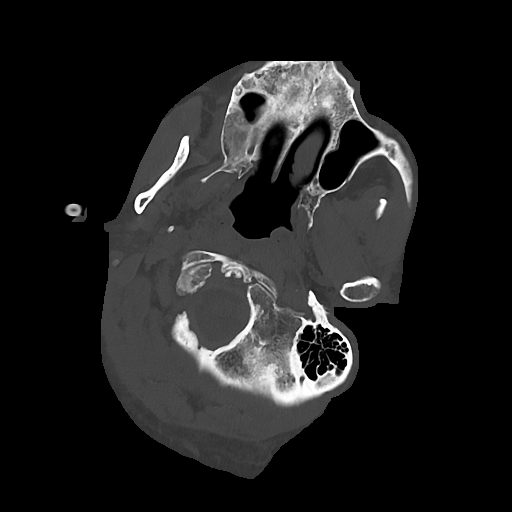
[im 17/85  bone]
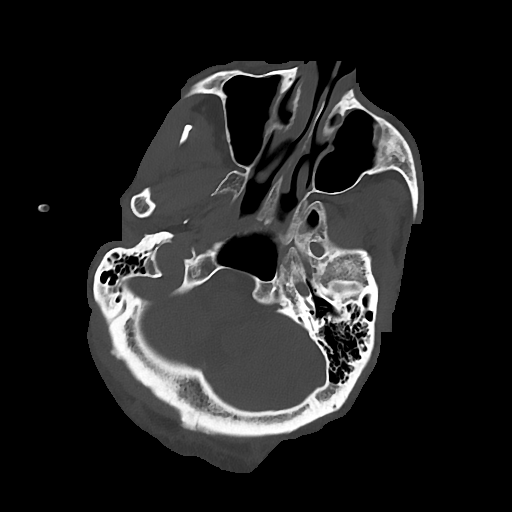

[Series 5: cor soft · coronal · 0.33mm/px · 3 of 76 slices shown]
[im 26/76  brain]
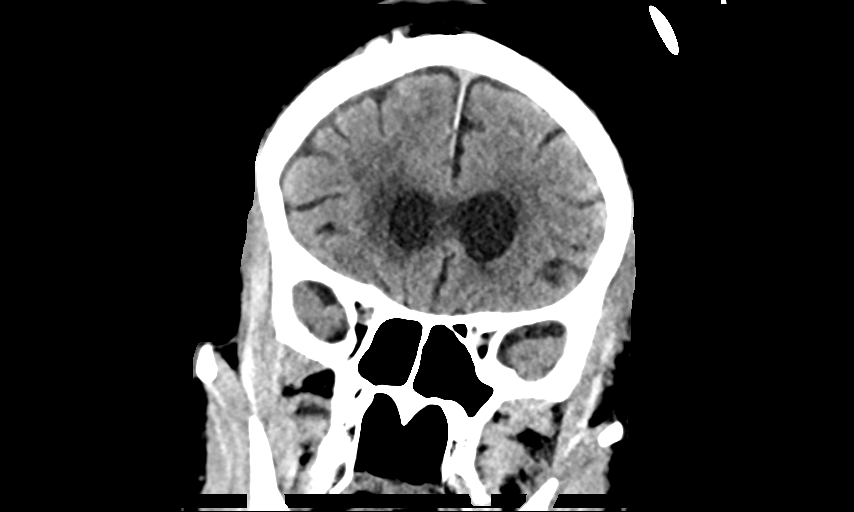
[im 34/76  brain]
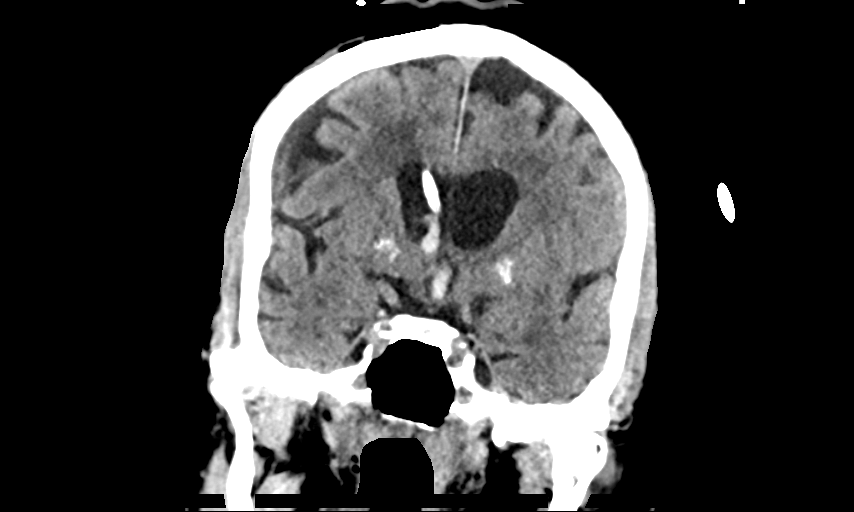
[im 42/76  brain]
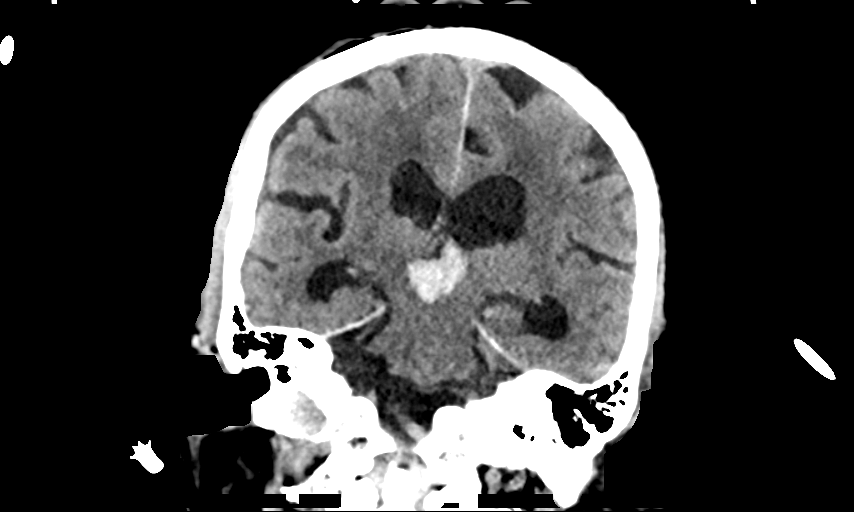

[Series 603: bonestr · axial · 0.48mm/px · z∈[-239,-105]mm · 9 of 85 slices shown, 12 images]
[im 9/85  brain]
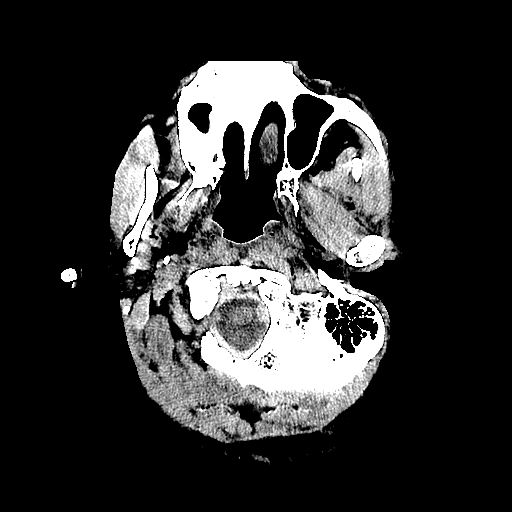
[im 9/85  bone]
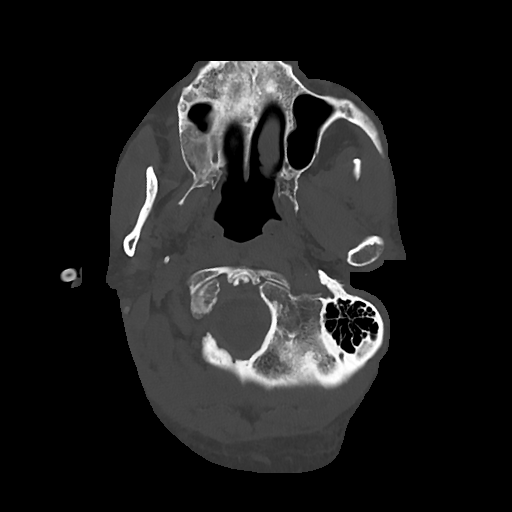
[im 17/85  brain]
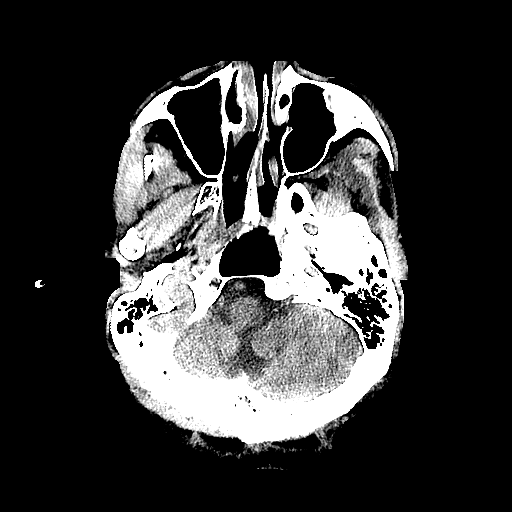
[im 26/85  brain]
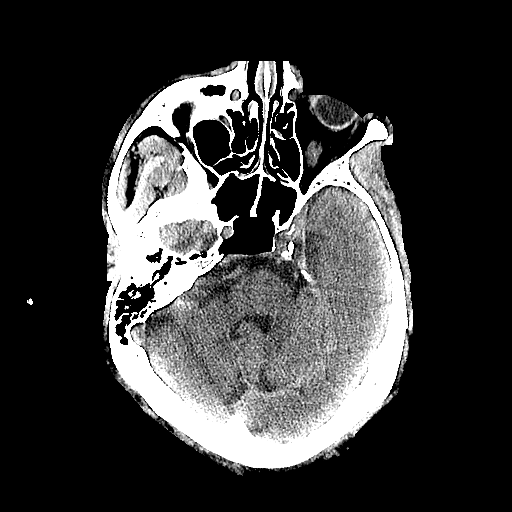
[im 34/85  brain]
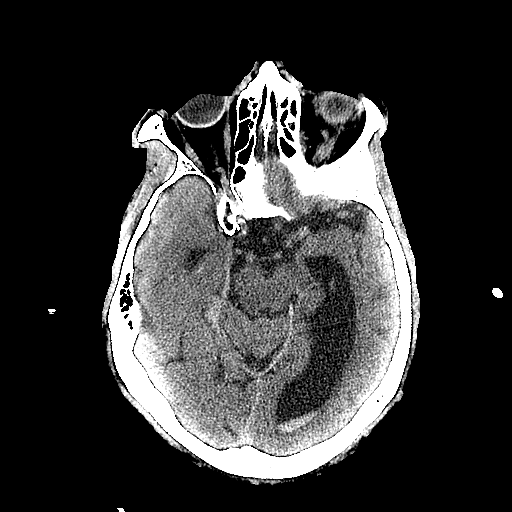
[im 43/85  brain]
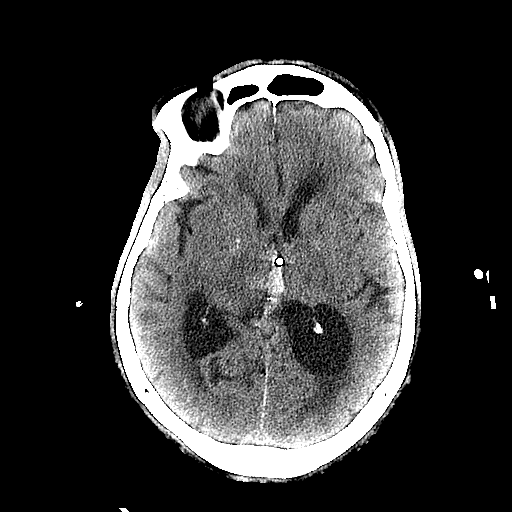
[im 43/85  bone]
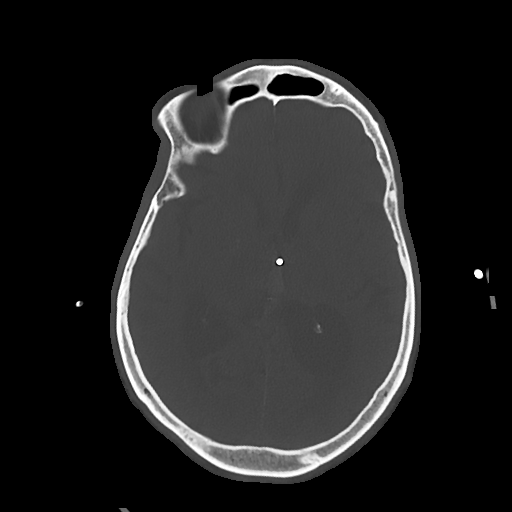
[im 51/85  brain]
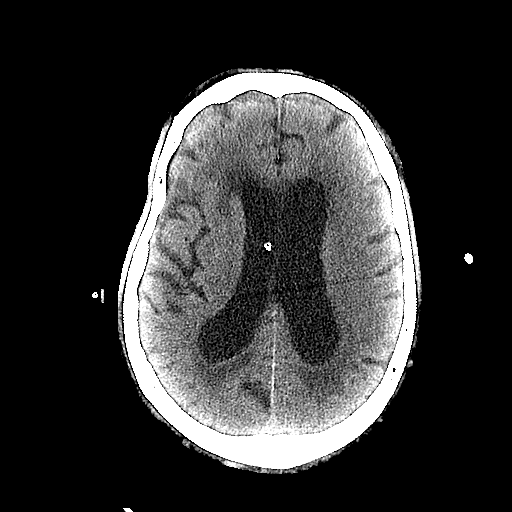
[im 59/85  brain]
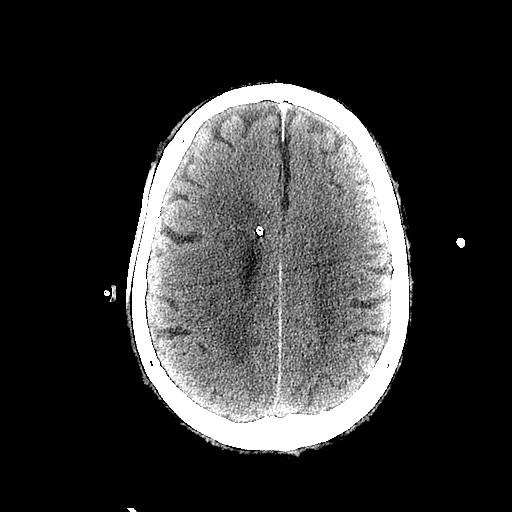
[im 68/85  brain]
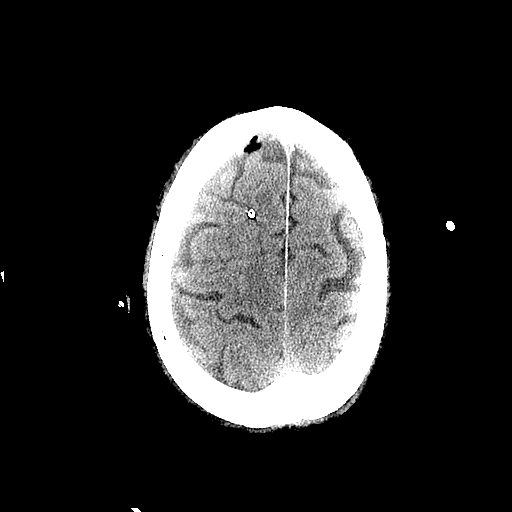
[im 76/85  brain]
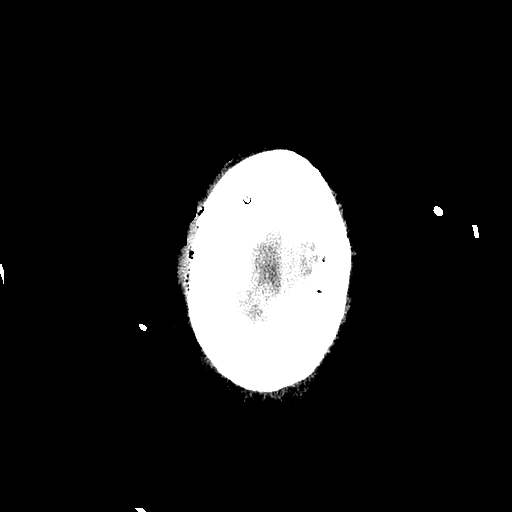
[im 76/85  bone]
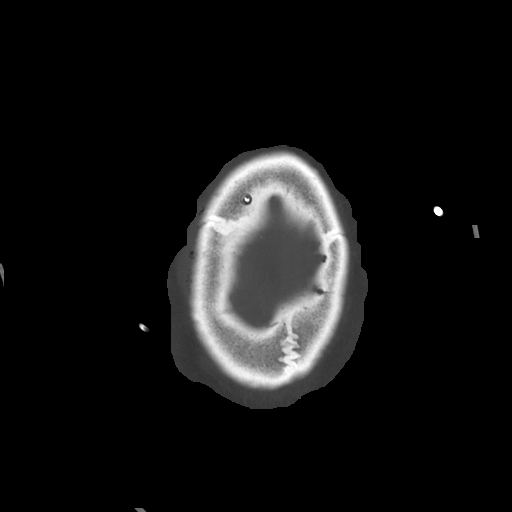

[14 of 47 positions shown; findings below may reference images not displayed]

FINDINGS: Brain: Stable hematoma centered in right thalamus and third
ventricle with small volume of hemorrhage in the lateral ventricles.
No new acute intracranial hemorrhage, focal mass effect, or large
stroke identified. Interval placement of a right frontal approach
ventriculostomy catheter traversing right foramen of Jon with tip
in third ventricle. Interval slight decrease in right lateral
ventricle size. Stable left ventricle size. Stable background of
chronic microvascular ischemic changes and parenchymal volume loss.

Vascular: Calcific atherosclerosis of carotid siphons.

Skull: Postsurgical changes related to right frontal burr hole with
mild edema and air in the overlying scalp.

Sinuses/Orbits: Mild paranasal sinus mucosal thickening. Otherwise
negative. Bilateral intra-ocular lens replacement.

Other: None.
IMPRESSION: 1. Interval placement of right frontal approach ventriculostomy
catheter with tip in the third ventricle. Slight decrease in size of
right lateral ventricle. Stable size of left lateral ventricle.
2. Stable hematoma centered in right thalamus and third ventricles
with small volume of hemorrhage in lateral ventricles.
3. Expected postsurgical changes related to right frontal burr hole.
4. No new acute intracranial abnormality identified.

By: Cordova Foote M.D.
